# Patient Record
Sex: Female | Born: 1940 | Race: White | Hispanic: No | Smoking: Never smoker
Health system: Southern US, Community
[De-identification: ages and names within clinical notes are randomized; demographics above are authoritative.]

## PROBLEM LIST (undated history)

## (undated) DIAGNOSIS — M1990 Unspecified osteoarthritis, unspecified site: Secondary | ICD-10-CM

## (undated) DIAGNOSIS — K219 Gastro-esophageal reflux disease without esophagitis: Secondary | ICD-10-CM

## (undated) DIAGNOSIS — I10 Essential (primary) hypertension: Secondary | ICD-10-CM

## (undated) DIAGNOSIS — E079 Disorder of thyroid, unspecified: Secondary | ICD-10-CM

## (undated) DIAGNOSIS — F419 Anxiety disorder, unspecified: Secondary | ICD-10-CM

## (undated) DIAGNOSIS — G4700 Insomnia, unspecified: Secondary | ICD-10-CM

## (undated) DIAGNOSIS — E039 Hypothyroidism, unspecified: Secondary | ICD-10-CM

## (undated) HISTORY — PX: TONSILLECTOMY: SUR1361

## (undated) HISTORY — PX: OTHER SURGICAL HISTORY: SHX169

## (undated) HISTORY — DX: Insomnia, unspecified: G47.00

## (undated) HISTORY — PX: ELBOW SURGERY: SHX618

## (undated) HISTORY — PX: ABDOMINAL HYSTERECTOMY: SHX81

## (undated) HISTORY — DX: Anxiety disorder, unspecified: F41.9

## (undated) HISTORY — DX: Hypothyroidism, unspecified: E03.9

---

## 2008-11-29 ENCOUNTER — Inpatient Hospital Stay (HOSPITAL_COMMUNITY): Admission: AD | Admit: 2008-11-29 | Discharge: 2008-12-03 | Payer: Self-pay | Admitting: Orthopaedic Surgery

## 2009-01-04 ENCOUNTER — Inpatient Hospital Stay (HOSPITAL_COMMUNITY): Admission: RE | Admit: 2009-01-04 | Discharge: 2009-01-06 | Payer: Self-pay | Admitting: Orthopaedic Surgery

## 2010-07-29 ENCOUNTER — Encounter: Payer: Self-pay | Admitting: Orthopaedic Surgery

## 2010-09-27 ENCOUNTER — Other Ambulatory Visit: Payer: Self-pay | Admitting: Ophthalmology

## 2010-09-27 ENCOUNTER — Encounter (HOSPITAL_COMMUNITY): Payer: Medicare Other

## 2010-09-27 LAB — CBC
HCT: 36.5 % (ref 36.0–46.0)
Hemoglobin: 11.7 g/dL — ABNORMAL LOW (ref 12.0–15.0)
MCH: 30.2 pg (ref 26.0–34.0)
MCHC: 32.1 g/dL (ref 30.0–36.0)
RBC: 3.87 MIL/uL (ref 3.87–5.11)

## 2010-09-27 LAB — BASIC METABOLIC PANEL
CO2: 24 mEq/L (ref 19–32)
Calcium: 8.6 mg/dL (ref 8.4–10.5)
Chloride: 104 mEq/L (ref 96–112)
GFR calc Af Amer: 53 mL/min — ABNORMAL LOW (ref >60)
Glucose, Bld: 127 mg/dL — ABNORMAL HIGH (ref 70–99)
Sodium: 137 mEq/L (ref 135–145)

## 2010-10-02 ENCOUNTER — Ambulatory Visit (HOSPITAL_COMMUNITY)
Admission: RE | Admit: 2010-10-02 | Discharge: 2010-10-02 | Disposition: A | Discharge status: Home / Self Care | Payer: Medicare Other | Source: Ambulatory Visit | Attending: Ophthalmology | Admitting: Ophthalmology

## 2010-10-02 DIAGNOSIS — H2510 Age-related nuclear cataract, unspecified eye: Secondary | ICD-10-CM | POA: Insufficient documentation

## 2010-10-02 DIAGNOSIS — J449 Chronic obstructive pulmonary disease, unspecified: Secondary | ICD-10-CM | POA: Insufficient documentation

## 2010-10-02 DIAGNOSIS — J4489 Other specified chronic obstructive pulmonary disease: Secondary | ICD-10-CM | POA: Insufficient documentation

## 2010-10-02 DIAGNOSIS — Z01812 Encounter for preprocedural laboratory examination: Secondary | ICD-10-CM | POA: Insufficient documentation

## 2010-10-16 LAB — DIFFERENTIAL
Eosinophils Absolute: 0.2 10*3/uL (ref 0.0–0.7)
Lymphocytes Relative: 29 % (ref 12–46)
Lymphs Abs: 2.5 10*3/uL (ref 0.7–4.0)
Monocytes Relative: 8 % (ref 3–12)
Neutro Abs: 5.2 10*3/uL (ref 1.7–7.7)
Neutrophils Relative %: 60 % (ref 43–77)

## 2010-10-16 LAB — CBC
Hemoglobin: 10.8 g/dL — ABNORMAL LOW (ref 12.0–15.0)
MCHC: 32.5 g/dL (ref 30.0–36.0)
MCV: 90.3 fL (ref 78.0–100.0)
RBC: 3.67 MIL/uL — ABNORMAL LOW (ref 3.87–5.11)
RDW: 14.7 % (ref 11.5–15.5)

## 2010-10-16 LAB — COMPREHENSIVE METABOLIC PANEL
BUN: 10 mg/dL (ref 6–23)
CO2: 27 mEq/L (ref 19–32)
Calcium: 8.7 mg/dL (ref 8.4–10.5)
Creatinine, Ser: 1.38 mg/dL — ABNORMAL HIGH (ref 0.4–1.2)
GFR calc non Af Amer: 38 mL/min — ABNORMAL LOW (ref >60)
Glucose, Bld: 101 mg/dL — ABNORMAL HIGH (ref 70–99)
Sodium: 135 mEq/L (ref 135–145)
Total Protein: 6.7 g/dL (ref 6.0–8.3)

## 2010-10-16 LAB — URINALYSIS, ROUTINE W REFLEX MICROSCOPIC
Glucose, UA: NEGATIVE mg/dL
Hgb urine dipstick: NEGATIVE
Protein, ur: NEGATIVE mg/dL
Specific Gravity, Urine: 1.01 (ref 1.005–1.030)
pH: 5.5 (ref 5.0–8.0)

## 2010-10-16 LAB — URINE MICROSCOPIC-ADD ON

## 2010-10-17 LAB — DIFFERENTIAL
Basophils Absolute: 0 10*3/uL (ref 0.0–0.1)
Basophils Relative: 0 % (ref 0–1)
Lymphocytes Relative: 16 % (ref 12–46)
Neutro Abs: 8.7 10*3/uL — ABNORMAL HIGH (ref 1.7–7.7)
Neutrophils Relative %: 76 % (ref 43–77)

## 2010-10-17 LAB — BASIC METABOLIC PANEL
CO2: 24 mEq/L (ref 19–32)
Calcium: 8.7 mg/dL (ref 8.4–10.5)
Creatinine, Ser: 1.68 mg/dL — ABNORMAL HIGH (ref 0.4–1.2)
GFR calc Af Amer: 37 mL/min — ABNORMAL LOW (ref >60)
GFR calc non Af Amer: 28 mL/min — ABNORMAL LOW (ref >60)
GFR calc non Af Amer: 30 mL/min — ABNORMAL LOW (ref >60)
Glucose, Bld: 123 mg/dL — ABNORMAL HIGH (ref 70–99)
Potassium: 4.3 mEq/L (ref 3.5–5.1)
Sodium: 130 mEq/L — ABNORMAL LOW (ref 135–145)

## 2010-10-17 LAB — CBC
HCT: 30.4 % — ABNORMAL LOW (ref 36.0–46.0)
Hemoglobin: 10.7 g/dL — ABNORMAL LOW (ref 12.0–15.0)
MCHC: 35 g/dL (ref 30.0–36.0)
RBC: 3.35 MIL/uL — ABNORMAL LOW (ref 3.87–5.11)
RBC: 3.73 MIL/uL — ABNORMAL LOW (ref 3.87–5.11)
RDW: 14.7 % (ref 11.5–15.5)
WBC: 11 10*3/uL — ABNORMAL HIGH (ref 4.0–10.5)

## 2010-11-01 ENCOUNTER — Encounter (HOSPITAL_COMMUNITY): Payer: Medicare Other

## 2010-11-06 ENCOUNTER — Ambulatory Visit (HOSPITAL_COMMUNITY)
Admission: RE | Admit: 2010-11-06 | Discharge: 2010-11-06 | Disposition: A | Discharge status: Home / Self Care | Payer: Medicare Other | Source: Ambulatory Visit | Attending: Ophthalmology | Admitting: Ophthalmology

## 2010-11-06 DIAGNOSIS — J449 Chronic obstructive pulmonary disease, unspecified: Secondary | ICD-10-CM | POA: Insufficient documentation

## 2010-11-06 DIAGNOSIS — J4489 Other specified chronic obstructive pulmonary disease: Secondary | ICD-10-CM | POA: Insufficient documentation

## 2010-11-06 DIAGNOSIS — H2510 Age-related nuclear cataract, unspecified eye: Secondary | ICD-10-CM | POA: Insufficient documentation

## 2010-11-21 NOTE — Discharge Summary (Signed)
NAMEFANY, Kim Schultz               ACCOUNT NO.:  [id number]   MEDICAL RECORD NO.:  [id number]          PATIENT TYPE:  INP   LOCATION:  A305                          FACILITY:  [facility]   PHYSICIAN:  J. Darreld Mclean, M.D. DATE OF BIRTH:  [birth year]   DATE OF ADMISSION:  11/29/2008  DATE OF DISCHARGE:  LH                               DISCHARGE SUMMARY   DISCHARGE DIAGNOSES:  Fracture dislocation of the left elbow with  fracture of the left olecranon, fracture of the radial head displaced,  and fracture of the coronoid process nondisplaced.   DISCHARGE STATUS:  Improved.   PROGNOSIS:  Good.   DISPOSITION:  Home.   SURGICAL PROCEDURES:  Open treatment internal fixation of left ulnar  fracture with a figure-of-eight compression system, removal of the  radial head, treatment nondisplaced fracture of the coronoid process,  application of posterior splint.   The patient is to continue her medicines on previously, and I have added  Vicodin 5 mg one every 4 hours p.r.n. pain.  Instructions given.  Medical reconciliation form has been completed.   The patient is to keep her arm elevated in a sling and keep it dry.   I will see her in my office on June 8 at 1:30 p.m. with x-rays at that  time out of the splint, we will remove the staples and put her in a new  cast.   She is to contact me if there is any difficulty and numbers have been  provided.   BRIEF HISTORY:  The patient fell on the day before admission at her home  and injured her left elbow.  She waited during the night in pain, came  to see me in the office early the next morning.  X-rays shows  significant injury.  The patient already eighth time I saw her.  She was  admitted to the hospital for pain control because she was in severe pain  and surgery was scheduled the following day.  She underwent the  procedure well.  Postoperatively, she has done well.  She had 1 only  episode of a temperature spike and rest of the  temperatures have been  normal.  Her labs have been normal.  Neurovascular status has been  intact.  Pain was controlled with the IV PCA morphine and then that was  discontinued because she had some low oxygen sats, in the last day, she  has been on Vicodin 5.  Kept her in  the hospital an extra day because two nights ago, she was having low  oxygen.  She had no low oxygenation last night while on Vicodin.  Precautions have been discussed.  The care of the wound have been  discussed.  Other labs were negative.  It is difficult to contact me as  stated.                                            ______________________________  J. Darreld Mclean, M.D.  JWK/MEDQ  D:  12/03/2008  T:  12/03/2008  Job:  [id number]

## 2010-11-21 NOTE — Op Note (Signed)
Kim Schultz, Kim Schultz               ACCOUNT NO.:  [id number]   MEDICAL RECORD NO.:  [id number]          PATIENT TYPE:  INP   LOCATION:  A307                          FACILITY:  [facility]   PHYSICIAN:  J. Darreld Mclean, M.D. DATE OF BIRTH:  01-Jul-1941   DATE OF PROCEDURE:  DATE OF DISCHARGE:                               OPERATIVE REPORT   PREOPERATIVE DIAGNOSES:  Failure fixation of left elbow olecranon  fracture with loss of reduction.  Left olecranon fracture status post  radial head fracture and radial head removal.  No signs of infection.   POSTOPERATIVE DIAGNOSES:  Failure fixation of left elbow olecranon  fracture with loss of reduction.  Left olecranon fracture status post  radial head fracture and radial head removal.  No signs of infection.   PROCEDURES:  Removal of pins and wire of left elbow, repeat figure-of-  eight procedure with open treatment and internal reduction of left elbow  fracture using figure-of-eight wire and Kirschner wire bone grafting   ANESTHESIA:  General.   TOURNIQUET TIME:  1 hour and 4 minutes.   DRAINS:  None.   Posterior splint applied at the end of the procedure.   SURGEON:  J. Darreld Mclean, MD   INDICATIONS:  The patient is a 70 year old white female who fractured  left elbow on May 24.  She had surgery on May 25, to remove of the  radial head.  Initially, she did well, but seen her back in the office  and the fracture site has slipped, there has been a failure to  fixation.  The Kirschner wires were pulled out and the olecranon  fracture has separated.  No signs of infection.  Recommended a repeat  procedure.  The patient was shown the x-rays, was explained the need for  the repeat procedure.  She understands.  The patient is at increased  risk because of her age.   DESCRIPTION OF PROCEDURE:  The patient was seen in the holding area.  The left arm was identified as correct surgical site.  She placed a  mark, I placed a mark.  She was  brought to the operating room, given  general anesthesia while supine.  Tourniquet was placed and deflated in  the left upper arm.  She was prepped and draped in the usual manner.   I had a time-out identifying Ms. Rattigan as the patient and left elbow as  the correct surgical site.  All the surgical team knew each other and  all instrumentation were deemed to be properly positioned and removed  it.  The arm was wrapped circumferentially with an Esmarch bandage.  Tourniquet was inflated with 250 mmHg.  Esmarch bandage was removed.  Incision was made through previous incision, and the Kirschner wires in  the fracture site was identified.  Kirschner wires were removed and the  figure-of-eight wire was initially cut, but left in place distally.  Fracture site was debrided of granulation tissue, scar tissue, and the  edges were freshened up.  I elected to use bone graft, bone chips.  I  placed this on all sides of the  fracture and then reduced the fracture  and held it in place with 2 smooth Kirschner wires, then added a third  one.  Obtained x-rays.  Knees looked good.  Knees were through the ulna  distal to the fracture.  On original x-rays pins does not penetrate  through the cortices and I think this is why the fracture fell apart.  I  corrected with this.  Figure-of-eight wire was then placed.  The  previous trial and reduction was carried out with figure-of-eight wire  of the fracture making sure the bone graft fragments were maintained.  A  reduction of the olecranon.  X-rays were taken, showed good position and  alignment.  Wires were then cut, bent over the figure-of-eight wire,  then bone tapped in.  The wire had been reapproximated using closure  with a wire tightener.  The lateral had difficulty taking, we took  several laterals to get a true good lateral.  The fracture is reduced.  The wound was reapproximated using 2-0 chromic, 2-0 plain, and skin  staples.  Sterile dressing  applied.  Bulky dressing applied.  Posterior  splint applied.  Tourniquet was deflated after 1 hour and 4 minutes  prior to application of the splinting.  She tolerated the procedure  well.  She will go to recovery in good condition.  She will be admitted  overnight for pain control and observation.           ______________________________  Shela Commons. Darreld Mclean, M.D.     JWK/MEDQ  D:  01/04/2009  T:  01/05/2009  Job:  [id number]

## 2010-11-21 NOTE — Discharge Summary (Signed)
Kim Schultz, Kim Schultz               ACCOUNT NO.:  [id number]   MEDICAL RECORD NO.:  [id number]          PATIENT TYPE:  INP   LOCATION:  A307                          FACILITY:  [facility]   PHYSICIAN:  J. Darreld Mclean, M.D. DATE OF BIRTH:  06-01-1941   DATE OF ADMISSION:  01/04/2009  DATE OF DISCHARGE:  07/01/2010LH                               DISCHARGE SUMMARY   DIAGNOSES:  Fracture to left olecranon post dislocation post radial head  fracture with failure fixation, replacement of fixation.   DISCHARGE STATUS:  Improved.   PROGNOSIS:  Good.   OPERATION PERFORMED:  Removal of pins and wires left elbow, repeat  procedure, bone grafting fixation, open treatment internal fixation of  left olecranon fracture post fracture dislocation and fracture of radial  head.   DISCHARGE MEDICATIONS:  The patient is to resume all of the medications  she was on at admission including her Vicodin ES.  I have given her a  prescription for Vicodin ES.  She is getting low on that.  She is to  resume all the medications as listed on the medical reconciliation.  She  is here and incorporated by reference.   FOLLOWUP:  The patient will be seen in my office in approximately two  weeks for removal of the splint, x-rays out of the cast and removal of  staples.   SPECIFIC CARE INSTRUCTIONS:  Use her sling.  Keep the cast dry.  Move  her fingers as often as possible.  Call me with any difficulties.   BRIEF HISTORY:  The patient was treated medically because she had  failure of fixation of left elbow fracture.  She underwent the above  mentioned procedures, tolerated well.  The first night she had a  temperature one time to 101, but responded to respiratory therapy and  she had no further episodes of temperature.  She has remained afebrile.  Her pain has been controlled.  Today, she is doing very well using the  PCA pump very little.  She can go back on her Vicodin ES as she had at  home.  She knows how to  care for the arm.  If you have any difficulties  let me know.  She can sleep semi-upright.  She will stay in it  approximately two weeks.  She has the numbers for the office and for the  hospital.                                            ______________________________  J. Darreld Mclean, M.D.     JWK/MEDQ  D:  01/06/2009  T:  01/06/2009  Job:  [id number]

## 2010-11-21 NOTE — Op Note (Signed)
NAMEALYNE, Kim Schultz               ACCOUNT NO.:  [id number]   MEDICAL RECORD NO.:  [id number]          PATIENT TYPE:  INP   LOCATION:  A305                          FACILITY:  [facility]   PHYSICIAN:  J. Darreld Mclean, M.D. DATE OF BIRTH:  1941-04-22   DATE OF PROCEDURE:  DATE OF DISCHARGE:                               OPERATIVE REPORT   PREOPERATIVE DIAGNOSES:  Comminuted fracture of the left olecranon,  dislocation of the left elbow, and fracture of the radial head,  irreducible.   POSTOPERATIVE DIAGNOSES:  Comminuted fracture of the left olecranon,  dislocation of the left elbow, and fracture of the radial head,  irreducible.   PROCEDURES:  Excision of left radial head of the elbow.  Open treatment  and internal fixation of the left olecranon fracture using a figure-of-  eight wire and pins.  Debridement of elbow area.   ANESTHESIA:  General.   TOURNIQUET TIME:  56 minutes.   DRAINS:  None used.   Posterior splint applied.   SURGEON:  J. Darreld Mclean, MD   INDICATIONS:  The patient is a 70 year old female who fell 2 days ago  and injured her left elbow, it happened late in the evening.  She came  to my office yesterday as a walk-in, had marked pain and tenderness in  elbow with deformity.  X-ray showed the fracture of the radial hip with  dislocation and irreducible, fracture of the olecranon and the  displacement of the elbow.  Surgery was recommended.  She has already  had breakfast and had eaten.  She was in some significant pain.  I had  her admitted for IV pain control.  Surgery was scheduled today.  She  abraded her knee, but there was more of a skin injury than a bone  injury.  No other injuries.  I discussed with her the risks and  imponderables of the procedure.  This included infection, instability of  the elbow because of removal of the radial head, possible decreased  healing, possible need for further surgery, and nerve injury.  All her  nerve was intact.  She  appeared to understand the procedure and agreed  to it as stated.  She asked appropriate questions.  Members of family  were also present and asked appropriate questions.   DESCRIPTION OF PROCEDURE:  The patient was seen in the holding area.  Her left arm and elbow were identified as correct surgical site.  She  placed a mark, I placed a mark.  She was brought back to the operating  room, placed supine on the operating room table, given general  anesthesia.  Once anesthesia was obtained, guide x-rays of her elbow,  was trying to do a mild closed reduction to ascertain better positioning  of the olecranon fracture and whether the coronoid process had a  fracture.  X-rays did not reveal a definitive fracture of the coronoid.  The comminuted fracture of the olecranon was identified and the radial  head was obviously displaced.  Tourniquet was placed and deflated in the  left upper arm and then she was prepped and draped  in the usual manner.   At a generalized time-out, identified the patient as Kim Schultz,  identified the left arm as correct surgical site.  Members of the team  were identified.  There was a student present who was identified.  All  instrumentation was deemed properly positioned and available and ready.  She had previously been given 1 g of Ancef prior to the induction of  anesthesia.   Arm was elevated, wrapped circumferentially with an Esmarch bandage.  Tourniquet inflated to 250 mmHg.  Esmarch bandage was removed.  Incision  was made over the olecranon area extending proximally and distally.  Fracture site was identified at the olecranon.  There was hematoma  present.  This was removed.  By opening up the olecranon fracture,  distracted, at this time I could see the radial head, it was very  prominent, and removed this without difficulty.  We debrided the area  and removed small fragments.  The area was cleansed thoroughly.  I did  not appreciate any other bony fragments.   The olecranon was then  reapproximated and held in place with a clamp and then figure-of-eight  wire was placed, 18-gauge.  Two smooth Kirschner wires 0.062 were placed  and a figure-of-eight compression-type system was done with tightening  the figure-of-eight wire.  X-rays were taken throughout this and showed  good position alignment of the wires.  The wires were cut.  Final x-rays  were taken and these looked good in AP and lateral view.  Elbow was  stable.  Flexion and extension was stable.  There were no bony fragments  identified on x-ray.  The wound was then reapproximated using 2-0 plain  skin staples.  Sterile dressing was applied.  Bulky dressing was  applied.  Tourniquet deflated after 56 minutes.  Posterior splint was  applied.  The patient tolerated the procedure well and went to recovery  in good condition.  She will continue on her current medications.           ______________________________  Shela Commons. Darreld Mclean, M.D.     JWK/MEDQ  D:  11/30/2008  T:  12/01/2008  Job:  [id number]

## 2010-11-21 NOTE — H&P (Signed)
NAMEKAMRIN, SPATH               ACCOUNT NO.:  [id number]   MEDICAL RECORD NO.:  [id number]          PATIENT TYPE:  INP   LOCATION:  NA                            FACILITY:  [facility]   PHYSICIAN:  J. Darreld Mclean, M.D. DATE OF BIRTH:  1941/07/05   DATE OF ADMISSION:  DATE OF DISCHARGE:  LH                              HISTORY & PHYSICAL   CHIEF COMPLAINT:  I fell and hurt my elbow.   Patient presented to my office this morning after we opened with  complaints of severe pain and tenderness to her left elbow.  She fell  late last night and injured the elbow.  She felt a snap and a pop.  She  had significant pain all night.  She did not want to go to the emergency  room.  She has ecchymosis to the elbow, marked pain, marked decrease of  motion.  X-rays in the office show a fracture of the olecranon displaced  with a fracture of the radial head completely displaced.  The elbow was  basically located but multiple pieces.  The distal humerus looks intact.  There were no other injuries except to the left knee which she has some  skin abrasions.  There was no head injury.  No loss of consciousness.   She is a patient of Dr. Renard Matter.  She has a history of thyroid problems  for many years.  She takes thyroid medication.  She has a history of  GERD and takes Prilosec for this.  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   Patient has previously had a hysterectomy, foot surgery with pins, and  tonsillectomy and adenoidectomy many years ago.  Patient has a history  of depression.  Dr. Renard Matter is her family physician.  SHE HAS NO  ALLERGIES.   REVIEW OF SYSTEMS:  Are negative except for the things as stated.  There  is no history of heart disease.  No history of stroke.  No history of  pneumonia.  No history of GI problems.   Patient lives in [place] and is accompanied by her daughter   Patient is alert, cooperative, in pain.  VITAL SIGNS:   Stable.  HEENT:  Negative.  NECK:  Supple.  LUNGS:  Clear to P and A.  HEART:  Regular without murmur heard.  ABDOMEN:  Soft, obese, nontender without masses.  EXTREMITIES:  The left elbow has marked decreased range of motion and  pain.  Ecchymosis around the elbow with pain to any type of motion.  Left knee has abrasions to the knee but there is no effusion.  Other  extremities are negative.  So I am able to really test her back because  of her in her elbow.  CNS:  Intact.  SKIN:  Intact except for the abrasions and ecchymosis as noted.   IMPRESSION:  1. Comminuted fracture of the left olecranon with fracture of radial      head displaced.  2. Abrasions, left knee.  3. History of multiple arthralgias and back pain.  4. Thyroid  problems.  5. Gastroesophageal reflux disease.   PLAN:  I will admit her today for IV narcotics.  I will schedule her for  surgery first thing in the morning.  I will not do any anticoagulation  agents as she is having surgery in the morning.  I have applied a  posterior splint to her arm while she was in the office.                                            ______________________________  J. Darreld Mclean, M.D.     JWK/MEDQ  D:  11/29/2008  T:  11/29/2008  Job:  [id number]

## 2010-11-21 NOTE — H&P (Signed)
Kim Schultz, Kim Schultz               ACCOUNT NO.:  [id number]   MEDICAL RECORD NO.:  [id number]          PATIENT TYPE:  AMB   LOCATION:  DAY                           FACILITY:  [facility]   PHYSICIAN:  J. Darreld Mclean, M.D. DATE OF BIRTH:  1941/07/04   DATE OF ADMISSION:  DATE OF DISCHARGE:  LH                              HISTORY & PHYSICAL   CHIEF COMPLAINT:  My elbow is still hurting   The patient fell and injured her left elbow on May 23.  I saw her in the  office on the twenty-fourth and admitted her to the hospital with a  comminuted fracture of the proximal elbow olecranon area.  She had  surgery on her elbow on the twenty-fifth with a excision of left radial  head and a figure-of-eight wire pinning of the elbow with debridement.  Initially, she did well, but when I saw her back here in the office this  past week it was obvious that the figure-of-eight had come apart and the  pins.  The Kirschner wires were held in place and backed out.  This was  on June 24.  I told she would have to  repeat surgery to correct this  problem.  I told her that I have never happen to me before, but it has  happened and I showed the x-rays.  I took her out of her long arm cast  and put her in a new posterior splint.  She wanted to double check with  her insurance company before setting up surgery.  So I told her to come  back to my office on Monday, June 28, and we would confirm with the  insurance company about coverage.  We have since talked to the insurance  company and they will cover her surgery for the correction of this  problem.  She understands the necessity for having this done at this  time.  I have been forthright and I have shown her the x-rays and  explained what has happened.   The patient has a history of:  1. Thyroid problems for many years and takes thyroid supplement.  2. She has GERD, Prilosec.  3. Degenerative joint disease of multiple joints.  4. She has a history of  nervousness and anxiety.  5. She has a history of depression.   She is status post:  1. Hysterectomy.  2. Foot surgery.  3. Tonsillectomy.  4. Adenoidectomy.   Dr. Renard Matter is her family doctor.   SHE HAS NO ALLERGIES.   REVIEW OF SYSTEMS:  Negative except for the left elbow which has pain  and tenderness and prominence of the pins.  The wound is well-healed.  There is no discharge and no purulence.  There is no history of heart  disease.  No history of stroke.  No history of pneumonia.  She does have  GERD.  She does have depression and anxiety.   The patient lives in [place] and was accompanied by her daughter to  the office.   VITAL SIGNS:  Normal.  HEENT:  Negative.  NECK:  Supple.  LUNGS:  Clear to P and A.  HEART:  Regular rhythm without murmur  ABDOMEN:  Soft without masses.  Obese.  LEFT ELBOW:  There is prominence where the pins are at the left  olecranon area.  The wound looks good.  It is well-healed.  Sutures have  been removed.  There is no discharge, no redness.  No irritation.  Range  of motion of course is markedly decreased and she was put a posterior  splint.  OTHER EXTREMITIES:  Negative.  CNS:  Intact.  SKIN:  Intact.   IMPRESSION:  Loss of orthopedic fixation, need to repeat procedure,  fixation of the left olecranon fracture.   I have discussed with the patient planned procedure, risks and  imponderables.  She appears to understand.  Labs are pending.  She will  be admitted after surgery for pain control.                                            ______________________________  J. Darreld Mclean, M.D.     JWK/MEDQ  D:  01/03/2009  T:  01/03/2009  Job:  [id number]

## 2010-12-18 NOTE — Op Note (Signed)
  Kim Schultz, Kim Schultz               ACCOUNT NO.:  [id number]  MEDICAL RECORD NO.:  [id number]           PATIENT TYPE:  O  LOCATION:  DAYP                          FACILITY:  [facility]  PHYSICIAN:  Susanne Greenhouse, MD       DATE OF BIRTH:  [birth year]  DATE OF PROCEDURE: DATE OF DISCHARGE:  11/06/2010                              OPERATIVE REPORT   PREOPERATIVE DIAGNOSIS:  Nuclear cataract, right eye.  POSTOPERATIVE DIAGNOSIS:  Nuclear cataract, right eye.  DIAGNOSIS CODE:  366.16.  OPERATION PERFORMED:  Phacoemulsification with posterior chamber intraocular lens implantation, right eye.  SURGEON:  Bonne Dolores. Dwon Sky, MD  ANESTHESIA:  General endotracheal anesthesia.  OPERATIVE SUMMARY:  In the preoperative area, dilating drops were placed into the right eye.  The patient was then brought into the operating room where she was placed under general anesthesia.  The eye was then prepped and draped.  Beginning with a 75 blade, a paracentesis port was made at the surgeon's 2 o'clock position.  The anterior chamber was then filled with a 1% nonpreserved lidocaine solution with epinephrine.  This was followed by Viscoat to deepen the chamber.  A small fornix-based peritomy was performed superiorly.  Next, a single iris hook was placed through the limbus superiorly.  A 2.4-mm keratome blade was then used to make a clear corneal incision over the iris hook.  A bent cystotome needle and Utrata forceps were used to create a continuous tear capsulotomy.  Hydrodissection was performed using balanced salt solution on a fine cannula.  The lens nucleus was then removed using phacoemulsification in a quadrant cracking technique.  The cortical material was then removed with irrigation and aspiration.  The capsular bag and anterior chamber were refilled with Provisc.  The wound was widened to approximately 3 mm and a posterior chamber intraocular lens was placed into the capsular bag without difficulty using  an [employer] lens injecting system.  A single 10-0 nylon suture was then used to close the incision as well as stromal hydration.  The Provisc was removed from the anterior chamber and capsular bag with irrigation and aspiration.  At this point, the wounds were tested for leak, which were negative.  The anterior chamber remained deep and stable.  The patient tolerated the procedure well.  There were no operative complications, and she awoke from general anesthesia without problem.  Prosthetic device used is a Lenstec posterior chamber lens, model Softec HD, power of 17.5, serial number is [id number].          ______________________________ Susanne Greenhouse, MD     KEH/MEDQ  D:  11/16/2010  T:  11/17/2010  Job:  [id number]  Electronically Signed by Gemma Payor MD on 12/18/2010 12:18:25 PM

## 2015-09-06 ENCOUNTER — Ambulatory Visit: Payer: Medicare Other | Admitting: Orthopaedic Surgery

## 2015-09-13 ENCOUNTER — Ambulatory Visit (INDEPENDENT_AMBULATORY_CARE_PROVIDER_SITE_OTHER): Payer: Medicare Other | Admitting: Orthopaedic Surgery

## 2015-09-13 VITALS — BP 206/114 | HR 91 | Temp 98.1°F | Ht 68.0 in | Wt 191.4 lb

## 2015-09-13 DIAGNOSIS — M25562 Pain in left knee: Secondary | ICD-10-CM | POA: Diagnosis not present

## 2015-09-13 MED ORDER — OXAPROZIN 600 MG PO TABS: 600.0000 mg | ORAL_TABLET | Freq: Two times a day (BID) | ORAL | Status: DC

## 2015-09-13 MED ORDER — HYDROCODONE-ACETAMINOPHEN 7.5-325 MG PO TABS: 1.0000 | ORAL_TABLET | ORAL | Status: DC | PRN

## 2015-09-13 NOTE — Progress Notes (Addendum)
Patient ZO:XWRUEAVW:Kim Schultz, female DOB:10/12/1940, 75 y.o. UJW:[id number]  Chief Complaint  Patient presents with  . Knee Pain    left  . Elbow Pain    left    HPI  Kim Schultz is a 75 y.o. female who has left knee pain chronically.  She has no giving way, no locking, no redness, no trauma.  She has swelling and popping.  Knee Pain  The incident occurred more than 1 week ago. There was no injury mechanism. The pain is present in the left knee. The pain is at a severity of 3/10. The pain is mild. The pain has been fluctuating since onset. Associated symptoms include an inability to bear weight and a loss of motion. The symptoms are aggravated by weight bearing. She has tried ice, heat, elevation and rest for the symptoms. The treatment provided moderate relief.    Body mass index is 29.11 kg/(m^2).   Review of Systems  Constitutional:       Patient does not have Diabetes Mellitus. Patient does not have hypertension. Patient does not have COPD or shortness of breath. Patient does not have BMI > 35. Patient does not have current smoking history.  HENT: Negative for congestion.   Respiratory: Negative for cough and shortness of breath.   Cardiovascular: Negative for chest pain.  Endocrine: Positive for cold intolerance.  Musculoskeletal: Positive for myalgias, back pain, joint swelling, arthralgias and gait problem.  Allergic/Immunologic: Positive for environmental allergies.  Psychiatric/Behavioral: The patient is nervous/anxious.     No past medical history on file.  No past surgical history on file.  No family history on file.  Social History Social History  Substance Use Topics  . Smoking status: Not on file  . Smokeless tobacco: Not on file  . Alcohol Use: Not on file    Allergies  Allergen Reactions  . Naprosyn [Naproxen]     Current Outpatient Prescriptions  Medication Sig Dispense Refill  . cyclobenzaprine (FLEXERIL) 10 MG tablet Take 10 mg by mouth 3  (three) times daily as needed for muscle spasms.    Marland Kitchen. dicyclomine (BENTYL) 10 MG capsule Take 10 mg by mouth 4 (four) times daily -  before meals and at bedtime.    Marland Kitchen. levothyroxine (SYNTHROID, LEVOTHROID) 125 MCG tablet Take 125 mcg by mouth daily before breakfast.    . oxaprozin (DAYPRO) 600 MG tablet Take 1 tablet (600 mg total) by mouth 2 (two) times daily after a meal. 60 tablet 5  . HYDROcodone-acetaminophen (NORCO) 7.5-325 MG tablet Take 1 tablet by mouth every 4 (four) hours as needed for moderate pain (Must last 30 days.  Do not drive or operate machinery while taking this medicine.). 120 tablet 0  . omeprazole (PRILOSEC) 20 MG capsule      No current facility-administered medications for this visit.     Physical Exam  Blood pressure 206/114, pulse 91, temperature 98.1 F (36.7 C), height 5\' 8"  (1.727 m), weight 191 lb 6.4 oz (86.818 kg).  Constitutional: overall normal hygiene, normal nutrition, well developed, normal grooming, normal body habitus. Assistive device:none  Musculoskeletal: gait and station Limp right, muscle tone and strength are normal, no tremors or atrophy is present.  .  Neurological: coordination overall normal.  Deep tendon reflex/nerve stretch intact.  Sensation normal.  Cranial nerves II-XII intact.   Skin:   normal overall no scars except to the left elbow area, lesions, ulcers or rashes. No psoriasis.  Psychiatric: Alert and oriented x 3.  Recent memory  intact, remote memory unclear.  Normal mood and affect. Well groomed.  Good eye contact.  Cardiovascular: overall no swelling, no varicosities, no edema bilaterally, normal temperatures of the legs and arms, no clubbing, cyanosis and good capillary refill.  Lymphatic: palpation is normal. The right lower extremity is examined:  Inspection:  Thigh:  Non-tender and no defects  Knee has swelling 2+ effusion.                        Joint tenderness is present                        Patient is tender  over the medial joint line  Lower Leg:  Has normal appearance and no tenderness or defects  Ankle:  Non-tender and no defects  Foot:  Non-tender and no defects Range of Motion:  Knee:  Range of motion is: 0 to 105                        Crepitus is  present  Ankle:  Range of motion is normal. Strength and Tone:  The left lower extremity has normal strength and tone. Stability:  Knee:  The knee is stable.  Ankle:  The ankle is stable.  She has a lot of social issues and is nervous about her living situation.  I spent some time talking to her about this.  I have gone over exercise program with her.  She tries to be active.  I will refill her Day Pro and also her pain medicine  The patient has been educated about the nature of the problem(s) and counseled on treatment options.  The patient appeared to understand what I have discussed and is in agreement with it.  Encounter Diagnosis  Name Primary?  . Left knee pain Yes    PLAN Call if any problems.  Precautions discussed.  Continue current medications.   Return to clinic 3 months

## 2015-10-03 NOTE — Addendum Note (Signed)
Addended by: Earnstine RegalKEELING, JOHN W on: 10/03/2015 10:11 PM   Modules accepted: Kipp BroodSmartSet

## 2015-12-13 ENCOUNTER — Ambulatory Visit (INDEPENDENT_AMBULATORY_CARE_PROVIDER_SITE_OTHER): Payer: Medicare Other | Admitting: Orthopaedic Surgery

## 2015-12-13 ENCOUNTER — Ambulatory Visit (INDEPENDENT_AMBULATORY_CARE_PROVIDER_SITE_OTHER): Payer: Medicare Other

## 2015-12-13 VITALS — BP 163/105 | HR 97 | Temp 97.7°F | Ht 68.0 in | Wt 190.8 lb

## 2015-12-13 DIAGNOSIS — M25562 Pain in left knee: Secondary | ICD-10-CM

## 2015-12-13 DIAGNOSIS — G8929 Other chronic pain: Secondary | ICD-10-CM

## 2015-12-13 DIAGNOSIS — M25522 Pain in left elbow: Secondary | ICD-10-CM | POA: Diagnosis not present

## 2015-12-13 MED ORDER — HYDROCODONE-ACETAMINOPHEN 7.5-325 MG PO TABS: 1.0000 | ORAL_TABLET | ORAL | Status: DC | PRN

## 2015-12-13 NOTE — Patient Instructions (Signed)
Discussed possibility ofRt TKA.

## 2015-12-13 NOTE — Progress Notes (Signed)
Patient EA:VWUJWJXB:Kim Schultz, female DOB:11/10/1940, 75 y.o. JYN:[id number]  Chief Complaint  Patient presents with  . Follow-up    left knee and left arm    HPI  Kim Schultz is a 75 y.o. female who has chronic pain of the left elbow post surgery of the elbow years ago.  She has no redness, no paresthesias, no new trauma.  She has developed pain in the left knee with giving way at times, swelling and popping.  She has no locking, no new trauma, no redness.  It is not getting any better.  She has had this pain on and off for many months but this is the first time she has told me about it.  She has tried ice, heat, rest, elevation with little help.  HPI  Body mass index is 29.02 kg/(m^2).  ROS  Review of Systems  Constitutional:       Patient does not have Diabetes Mellitus. Patient does not have hypertension. Patient does not have COPD or shortness of breath. Patient does not have BMI > 35. Patient does not have current smoking history.  HENT: Negative for congestion.   Respiratory: Negative for cough and shortness of breath.   Cardiovascular: Negative for chest pain.  Endocrine: Positive for cold intolerance.  Musculoskeletal: Positive for myalgias, back pain, joint swelling, arthralgias and gait problem.  Allergic/Immunologic: Positive for environmental allergies.  Psychiatric/Behavioral: The patient is nervous/anxious.     No past medical history on file.  No past surgical history on file.  No family history on file.  Social History Social History  Substance Use Topics  . Smoking status: Not on file  . Smokeless tobacco: Not on file  . Alcohol Use: Not on file    Allergies  Allergen Reactions  . Naprosyn [Naproxen]     Current Outpatient Prescriptions  Medication Sig Dispense Refill  . cyclobenzaprine (FLEXERIL) 10 MG tablet Take 10 mg by mouth 3 (three) times daily as needed for muscle spasms.    Marland Kitchen. dicyclomine (BENTYL) 10 MG capsule Take 10 mg by mouth 4  (four) times daily -  before meals and at bedtime.    Marland Kitchen. HYDROcodone-acetaminophen (NORCO) 7.5-325 MG tablet Take 1 tablet by mouth every 4 (four) hours as needed for moderate pain (Must last 30 days.  Do not drive or operate machinery while taking this medicine.). 120 tablet 0  . levothyroxine (SYNTHROID, LEVOTHROID) 125 MCG tablet Take 125 mcg by mouth daily before breakfast.    . omeprazole (PRILOSEC) 20 MG capsule     . oxaprozin (DAYPRO) 600 MG tablet Take 1 tablet (600 mg total) by mouth 2 (two) times daily after a meal. 60 tablet 5   No current facility-administered medications for this visit.     Physical Exam  Blood pressure 163/105, pulse 97, temperature 97.7 F (36.5 C), height 5\' 8"  (1.727 m), weight 190 lb 12.8 oz (86.546 kg).  Constitutional: overall normal hygiene, normal nutrition, well developed, normal grooming, normal body habitus. Assistive device:none  Musculoskeletal: gait and station Limp left, muscle tone and strength are normal, no tremors or atrophy is present.  .  Neurological: coordination overall normal.  Deep tendon reflex/nerve stretch intact.  Sensation normal.  Cranial nerves II-XII intact.   Skin:   normal overall no scars, lesions, ulcers or rashes. No psoriasis.  Psychiatric: Alert and oriented x 3.  Recent memory intact, remote memory unclear.  Normal mood and affect. Well groomed.  Good eye contact.  Cardiovascular: overall no swelling,  no varicosities, no edema bilaterally, normal temperatures of the legs and arms, no clubbing, cyanosis and good capillary refill.  Lymphatic: palpation is normal.  The left lower extremity is examined:  Inspection:  Thigh:  Non-tender and no defects  Knee has swelling 1+ effusion.                        Joint tenderness is present                        Patient is tender over the medial joint line  Lower Leg:  Has normal appearance and no tenderness or defects  Ankle:  Non-tender and no defects  Foot:   Non-tender and no defects Range of Motion:  Knee:  Range of motion is: 0-105                        Crepitus is  present  Ankle:  Range of motion is normal. Strength and Tone:  The left lower extremity has normal strength and tone. Stability:  Knee:  The knee is stable.  Ankle:  The ankle is stable.  X-rays were ordered of the left knee, reported separately.  The patient has been educated about the nature of the problem(s) and counseled on treatment options.  The patient appeared to understand what I have discussed and is in agreement with it.  Encounter Diagnoses  Name Primary?  . Left knee pain Yes  . Elbow pain, chronic, left    She declines injection in the left knee today.   PLAN Call if any problems.  Precautions discussed.  Continue current medications.   Return to clinic 3 months   Electronically Signed Darreld Mclean, MD 6/6/20172:56 PM

## 2016-02-15 ENCOUNTER — Telehealth: Payer: Self-pay | Admitting: Orthopaedic Surgery

## 2016-02-15 NOTE — Telephone Encounter (Signed)
[facility]  oxaprozin (DAYPRO) 600 MG tablet 60 tablet 5

## 2016-02-16 MED ORDER — OXAPROZIN 600 MG PO TABS: 600.0000 mg | ORAL_TABLET | Freq: Two times a day (BID) | ORAL | 5 refills | Status: DC

## 2016-03-15 ENCOUNTER — Encounter: Payer: Self-pay | Admitting: Orthopaedic Surgery

## 2016-03-15 ENCOUNTER — Ambulatory Visit (INDEPENDENT_AMBULATORY_CARE_PROVIDER_SITE_OTHER): Payer: Medicare Other | Admitting: Orthopaedic Surgery

## 2016-03-15 VITALS — BP 164/99 | HR 95 | Temp 97.9°F | Ht 68.0 in | Wt 188.0 lb

## 2016-03-15 DIAGNOSIS — G8929 Other chronic pain: Secondary | ICD-10-CM | POA: Diagnosis not present

## 2016-03-15 DIAGNOSIS — M25562 Pain in left knee: Secondary | ICD-10-CM

## 2016-03-15 DIAGNOSIS — M25522 Pain in left elbow: Secondary | ICD-10-CM | POA: Diagnosis not present

## 2016-03-15 MED ORDER — HYDROCODONE-ACETAMINOPHEN 7.5-325 MG PO TABS: 1.0000 | ORAL_TABLET | ORAL | 0 refills | Status: DC | PRN

## 2016-03-15 MED ORDER — CYCLOBENZAPRINE HCL 10 MG PO TABS: 10.0000 mg | ORAL_TABLET | Freq: Three times a day (TID) | ORAL | 3 refills | Status: DC | PRN

## 2016-03-15 NOTE — Progress Notes (Signed)
Patient ZO:XWRUEAVW:Kim Schultz, female DOB:02/25/1941, 75 y.o. UJW:[id number]  Chief Complaint  Patient presents with  . Follow-up    left knee, left arm    HPI  Kim Schultz is a 75 y.o. female who has chronic left elbow and left knee pain.  She has no new trauma, no redness, no paresthesias.  She is active and taking her medicine. HPI  Body mass index is 28.59 kg/m.  ROS  Review of Systems  Constitutional:       Patient does not have Diabetes Mellitus. Patient does not have hypertension. Patient does not have COPD or shortness of breath. Patient does not have BMI > 35. Patient does not have current smoking history.  HENT: Negative for congestion.   Respiratory: Negative for cough and shortness of breath.   Cardiovascular: Negative for chest pain.  Endocrine: Positive for cold intolerance.  Musculoskeletal: Positive for arthralgias, back pain, gait problem, joint swelling and myalgias.  Allergic/Immunologic: Positive for environmental allergies.  Psychiatric/Behavioral: The patient is nervous/anxious.     No past medical history on file.  No past surgical history on file.  No family history on file.  Social History Social History  Substance Use Topics  . Smoking status: Never Smoker  . Smokeless tobacco: Never Used  . Alcohol use Not on file    Allergies  Allergen Reactions  . Naprosyn [Naproxen]     Current Outpatient Prescriptions  Medication Sig Dispense Refill  . cyclobenzaprine (FLEXERIL) 10 MG tablet Take 1 tablet (10 mg total) by mouth 3 (three) times daily as needed for muscle spasms. 30 tablet 3  . dicyclomine (BENTYL) 10 MG capsule Take 10 mg by mouth 4 (four) times daily -  before meals and at bedtime.    Marland Kitchen. HYDROcodone-acetaminophen (NORCO) 7.5-325 MG tablet Take 1 tablet by mouth every 4 (four) hours as needed for moderate pain (Must last 30 days.  Do not drive or operate machinery while taking this medicine.). 120 tablet 0  . levothyroxine  (SYNTHROID, LEVOTHROID) 125 MCG tablet Take 125 mcg by mouth daily before breakfast.    . omeprazole (PRILOSEC) 20 MG capsule     . oxaprozin (DAYPRO) 600 MG tablet Take 1 tablet (600 mg total) by mouth 2 (two) times daily after a meal. 60 tablet 5   No current facility-administered medications for this visit.      Physical Exam  Blood pressure (!) 164/99, pulse 95, temperature 97.9 F (36.6 C), height 5\' 8"  (1.727 m), weight 188 lb (85.3 kg).  Constitutional: overall normal hygiene, normal nutrition, well developed, normal grooming, normal body habitus. Assistive device:none  Musculoskeletal: gait and station Limp left, muscle tone and strength are normal, no tremors or atrophy is present.  .  Neurological: coordination overall normal.  Deep tendon reflex/nerve stretch intact.  Sensation normal.  Cranial nerves II-XII intact.   Skin:   Normal overall no scars, lesions, ulcers or rashes. No psoriasis.  Psychiatric: Alert and oriented x 3.  Recent memory intact, remote memory unclear.  Normal mood and affect. Well groomed.  Good eye contact.  Cardiovascular: overall no swelling, no varicosities, no edema bilaterally, normal temperatures of the legs and arms, no clubbing, cyanosis and good capillary refill.  Lymphatic: palpation is normal.  Her left elbow is tender. ROM 5 to 120.  NV intact.  The left lower extremity is examined:  Inspection:  Thigh:  Non-tender and no defects  Knee has swelling 1+  effusion.  Joint tenderness is present                        Patient is tender over the medial joint line  Lower Leg:  Has normal appearance and no tenderness or defects  Ankle:  Non-tender and no defects  Foot:  Non-tender and no defects Range of Motion:  Knee:  Range of motion is: 0-105                        Crepitus is  present  Ankle:  Range of motion is normal. Strength and Tone:  The left lower extremity has normal strength and  tone. Stability:  Knee:  The knee is stable.  Ankle:  The ankle is stable.     The patient has been educated about the nature of the problem(s) and counseled on treatment options.  The patient appeared to understand what I have discussed and is in agreement with it.  Encounter Diagnoses  Name Primary?  . Left knee pain Yes  . Elbow pain, chronic, left     PLAN Call if any problems.  Precautions discussed.  Continue current medications.   Return to clinic 3 months   Electronically Signed Darreld Mclean, MD 9/7/20171:49 PM

## 2016-06-14 ENCOUNTER — Ambulatory Visit (INDEPENDENT_AMBULATORY_CARE_PROVIDER_SITE_OTHER): Payer: Medicare Other | Admitting: Orthopaedic Surgery

## 2016-06-14 ENCOUNTER — Encounter: Payer: Self-pay | Admitting: Orthopaedic Surgery

## 2016-06-14 VITALS — BP 167/97 | HR 88 | Temp 97.7°F | Ht 68.0 in | Wt 185.0 lb

## 2016-06-14 DIAGNOSIS — M25562 Pain in left knee: Secondary | ICD-10-CM

## 2016-06-14 DIAGNOSIS — M25522 Pain in left elbow: Secondary | ICD-10-CM | POA: Diagnosis not present

## 2016-06-14 DIAGNOSIS — G8929 Other chronic pain: Secondary | ICD-10-CM

## 2016-06-14 MED ORDER — CYCLOBENZAPRINE HCL 10 MG PO TABS: 10.0000 mg | ORAL_TABLET | Freq: Every day | ORAL | 3 refills | Status: DC

## 2016-06-14 MED ORDER — HYDROCODONE-ACETAMINOPHEN 7.5-325 MG PO TABS: 1.0000 | ORAL_TABLET | ORAL | 0 refills | Status: DC | PRN

## 2016-06-14 NOTE — Progress Notes (Signed)
Patient ZO:XWRUEAVW:Kim Schultz, female DOB:08/25/1940, 75 y.o. UJW:[id number]  Chief Complaint  Patient presents with  . Follow-up    left leg and left elbow    HPI  Kim Schultz is a 75 y.o. female who has chronic knee pain on the left.  She has swelling and popping.  She has no trauma. She has no locking.  She has more pain in the cold weather.  She has pain in the left elbow, post old fracture.  She has no new trauma.  She is taking her medicine. HPI  Body mass index is 28.13 kg/m.  ROS  Review of Systems  Constitutional:       Patient does not have Diabetes Mellitus. Patient does not have hypertension. Patient does not have COPD or shortness of breath. Patient does not have BMI > 35. Patient does not have current smoking history.  HENT: Negative for congestion.   Respiratory: Negative for cough and shortness of breath.   Cardiovascular: Negative for chest pain.  Endocrine: Positive for cold intolerance.  Musculoskeletal: Positive for arthralgias, back pain, gait problem, joint swelling and myalgias.  Allergic/Immunologic: Positive for environmental allergies.  Psychiatric/Behavioral: The patient is nervous/anxious.     No past medical history on file.  No past surgical history on file.  No family history on file.  Social History Social History  Substance Use Topics  . Smoking status: Never Smoker  . Smokeless tobacco: Never Used  . Alcohol use Not on file    Allergies  Allergen Reactions  . Naprosyn [Naproxen]     Current Outpatient Prescriptions  Medication Sig Dispense Refill  . cyclobenzaprine (FLEXERIL) 10 MG tablet Take 1 tablet (10 mg total) by mouth at bedtime. 90 tablet 3  . dicyclomine (BENTYL) 10 MG capsule Take 10 mg by mouth 4 (four) times daily -  before meals and at bedtime.    Marland Kitchen. HYDROcodone-acetaminophen (NORCO) 7.5-325 MG tablet Take 1 tablet by mouth every 4 (four) hours as needed for moderate pain (Must last 30 days.  Do not drive or  operate machinery while taking this medicine.). 120 tablet 0  . levothyroxine (SYNTHROID, LEVOTHROID) 125 MCG tablet Take 125 mcg by mouth daily before breakfast.    . omeprazole (PRILOSEC) 20 MG capsule     . oxaprozin (DAYPRO) 600 MG tablet Take 1 tablet (600 mg total) by mouth 2 (two) times daily after a meal. 60 tablet 5   No current facility-administered medications for this visit.      Physical Exam  Blood pressure (!) 167/97, pulse 88, temperature 97.7 F (36.5 C), height 5\' 8"  (1.727 m), weight 185 lb (83.9 kg).  Constitutional: overall normal hygiene, normal nutrition, well developed, normal grooming, normal body habitus. Assistive device:none  Musculoskeletal: gait and station Limp left, muscle tone and strength are normal, no tremors or atrophy is present.  .  Neurological: coordination overall normal.  Deep tendon reflex/nerve stretch intact.  Sensation normal.  Cranial nerves II-XII intact.   Skin:   Normal overall no scars, lesions, ulcers or rashes. No psoriasis.  Psychiatric: Alert and oriented x 3.  Recent memory intact, remote memory unclear.  Normal mood and affect. Well groomed.  Good eye contact.  Cardiovascular: overall no swelling, no varicosities, no edema bilaterally, normal temperatures of the legs and arms, no clubbing, cyanosis and good capillary refill.  Lymphatic: palpation is normal.  The left lower extremity is examined:  Inspection:  Thigh:  Non-tender and no defects  Knee has swelling 1+  effusion.                        Joint tenderness is present                        Patient is tender over the medial joint line  Lower Leg:  Has normal appearance and no tenderness or defects  Ankle:  Non-tender and no defects  Foot:  Non-tender and no defects Range of Motion:  Knee:  Range of motion is: 0-100                        Crepitus is  present  Ankle:  Range of motion is normal. Strength and Tone:  The left lower extremity has normal strength  and tone. Stability:  Knee:  The knee is stable.  Ankle:  The ankle is stable.  Her left elbow has ROM of 5 to 115.  She has pain over the olecranon area.  She has well healed scars.  NV is intact.  The patient has been educated about the nature of the problem(s) and counseled on treatment options.  The patient appeared to understand what I have discussed and is in agreement with it.  Encounter Diagnoses  Name Primary?  . Chronic pain of left knee Yes  . Elbow pain, chronic, left     PLAN Call if any problems.  Precautions discussed.  Continue current medications.   Return to clinic 3 months   Electronically Signed Darreld McleanWayne Emelie Newsom, MD 12/7/20172:43 PM

## 2016-06-26 ENCOUNTER — Encounter (HOSPITAL_COMMUNITY): Payer: Self-pay

## 2016-06-26 ENCOUNTER — Emergency Department (HOSPITAL_COMMUNITY): Payer: Medicare Other

## 2016-06-26 ENCOUNTER — Emergency Department (HOSPITAL_COMMUNITY)
Admission: EM | Admit: 2016-06-26 | Discharge: 2016-06-27 | Disposition: A | Discharge status: Home / Self Care | Payer: Medicare Other | Attending: Emergency Medicine | Admitting: Emergency Medicine

## 2016-06-26 DIAGNOSIS — I10 Essential (primary) hypertension: Secondary | ICD-10-CM | POA: Diagnosis not present

## 2016-06-26 DIAGNOSIS — Z79899 Other long term (current) drug therapy: Secondary | ICD-10-CM | POA: Diagnosis not present

## 2016-06-26 DIAGNOSIS — K0889 Other specified disorders of teeth and supporting structures: Secondary | ICD-10-CM | POA: Diagnosis not present

## 2016-06-26 DIAGNOSIS — R519 Headache, unspecified: Secondary | ICD-10-CM

## 2016-06-26 DIAGNOSIS — R51 Headache: Secondary | ICD-10-CM

## 2016-06-26 HISTORY — DX: Gastro-esophageal reflux disease without esophagitis: K21.9

## 2016-06-26 HISTORY — DX: Disorder of thyroid, unspecified: E07.9

## 2016-06-26 HISTORY — DX: Unspecified osteoarthritis, unspecified site: M19.90

## 2016-06-26 HISTORY — DX: Essential (primary) hypertension: I10

## 2016-06-26 MED ORDER — HYDROCODONE-ACETAMINOPHEN 5-325 MG PO TABS
2.0000 | ORAL_TABLET | Freq: Once | ORAL | Status: AC
Start: 2016-06-27 — End: 2016-06-26
  Administered 2016-06-26: 2 via ORAL
  Filled 2016-06-26: qty 2

## 2016-06-26 MED ORDER — ONDANSETRON HCL 4 MG PO TABS
4.0000 mg | ORAL_TABLET | Freq: Once | ORAL | Status: AC
  Administered 2016-06-26: 4 mg via ORAL
  Filled 2016-06-26: qty 1

## 2016-06-26 NOTE — ED Triage Notes (Signed)
Patient states that she fell last Wednesday and injured her head, face, legs, left elbow.  Complaining with pain in her head and face.  My face is so sore that I can't hardly touch it per pt.  I had a pair of bedroom shoes on and the next thing I know I had fell.  Did not consciousness.

## 2016-06-27 ENCOUNTER — Emergency Department (HOSPITAL_COMMUNITY): Payer: Medicare Other

## 2016-06-27 DIAGNOSIS — K0889 Other specified disorders of teeth and supporting structures: Secondary | ICD-10-CM | POA: Diagnosis not present

## 2016-06-27 LAB — URINALYSIS, ROUTINE W REFLEX MICROSCOPIC
Bilirubin Urine: NEGATIVE
Glucose, UA: NEGATIVE mg/dL
Ketones, ur: NEGATIVE mg/dL
Nitrite: NEGATIVE
Protein, ur: NEGATIVE mg/dL
Specific Gravity, Urine: 1.005 (ref 1.005–1.030)
WBC, UA: NONE SEEN WBC/hpf (ref 0–5)
pH: 6 (ref 5.0–8.0)

## 2016-06-27 LAB — COMPREHENSIVE METABOLIC PANEL
ALT: 10 U/L — AB (ref 14–54)
AST: 23 U/L (ref 15–41)
Albumin: 3.5 g/dL (ref 3.5–5.0)
Alkaline Phosphatase: 55 U/L (ref 38–126)
Anion gap: 7 (ref 5–15)
BILIRUBIN TOTAL: 0.6 mg/dL (ref 0.3–1.2)
BUN: 10 mg/dL (ref 6–20)
CALCIUM: 8.7 mg/dL — AB (ref 8.9–10.3)
CO2: 24 mmol/L (ref 22–32)
CREATININE: 1.1 mg/dL — AB (ref 0.44–1.00)
Chloride: 102 mmol/L (ref 101–111)
GFR, EST AFRICAN AMERICAN: 56 mL/min — AB (ref >60)
GFR, EST NON AFRICAN AMERICAN: 48 mL/min — AB (ref >60)
Glucose, Bld: 115 mg/dL — ABNORMAL HIGH (ref 65–99)
Potassium: 3.8 mmol/L (ref 3.5–5.1)
Sodium: 133 mmol/L — ABNORMAL LOW (ref 135–145)
TOTAL PROTEIN: 6.8 g/dL (ref 6.5–8.1)

## 2016-06-27 LAB — CBC WITH DIFFERENTIAL/PLATELET
BASOS ABS: 0 10*3/uL (ref 0.0–0.1)
BASOS PCT: 0 %
EOS ABS: 0 10*3/uL (ref 0.0–0.7)
EOS PCT: 0 %
HCT: 37.4 % (ref 36.0–46.0)
Hemoglobin: 12.4 g/dL (ref 12.0–15.0)
Lymphocytes Relative: 27 %
Lymphs Abs: 2.4 10*3/uL (ref 0.7–4.0)
MCH: 32 pg (ref 26.0–34.0)
MCHC: 33.2 g/dL (ref 30.0–36.0)
MCV: 96.6 fL (ref 78.0–100.0)
MONO ABS: 0.7 10*3/uL (ref 0.1–1.0)
Monocytes Relative: 7 %
Neutro Abs: 5.8 10*3/uL (ref 1.7–7.7)
Neutrophils Relative %: 66 %
PLATELETS: 201 10*3/uL (ref 150–400)
RBC: 3.87 MIL/uL (ref 3.87–5.11)
RDW: 12.8 % (ref 11.5–15.5)
WBC: 8.9 10*3/uL (ref 4.0–10.5)

## 2016-06-27 LAB — LACTIC ACID, PLASMA: LACTIC ACID, VENOUS: 0.9 mmol/L (ref 0.5–1.9)

## 2016-06-27 MED ORDER — PENICILLIN V POTASSIUM 500 MG PO TABS: 500.0000 mg | ORAL_TABLET | Freq: Four times a day (QID) | ORAL | 0 refills | Status: AC

## 2016-06-27 MED ORDER — SODIUM CHLORIDE 0.9 % IV SOLN
1000.0000 mL | INTRAVENOUS | Status: DC
  Administered 2016-06-27: 1000 mL via INTRAVENOUS

## 2016-06-27 MED ORDER — SODIUM CHLORIDE 0.9 % IV SOLN
1000.0000 mL | Freq: Once | INTRAVENOUS | Status: AC
  Administered 2016-06-27: 1000 mL via INTRAVENOUS

## 2016-06-27 NOTE — ED Provider Notes (Signed)
By signing my name below, I, Kim Schultz, attest that this documentation has been prepared under the direction and in the presence of Kim AlbeIva Yani Coventry, MD . Electronically Signed: Levon HedgerElizabeth Schultz, Scribe. 06/27/2016. 1:02 AM.   Kim PatersonMargaret F Kever is a 75 y.o. female who presents to the Emergency Department complaining of constant, worsening left upper premolar dental pain which began yesterday. Pain is unchanged by heat or cold. No treatments tried PTA.  She notes associated diffuse facial pain.  She states she fell backwards last week and hit the back of her head.Pt denies LOC, fever, chills, dysuria, or any other associated symptoms.    Results for orders placed or performed during the hospital encounter of 06/26/16  Blood culture (routine x 2)  Result Value Ref Range   Specimen Description BLOOD RIGHT FOREARM    Special Requests BOTTLES DRAWN AEROBIC AND ANAEROBIC 10CC    Culture PENDING    Report Status PENDING   Blood culture (routine x 2)  Result Value Ref Range   Specimen Description BLOOD RIGHT FOREARM    Special Requests      BOTTLES DRAWN AEROBIC AND ANAEROBIC AEB 10CC ANA 4CC   Culture PENDING    Report Status PENDING   Urinalysis, Routine w reflex microscopic  Result Value Ref Range   Color, Urine STRAW (A) YELLOW   APPearance CLEAR CLEAR   Specific Gravity, Urine 1.005 1.005 - 1.030   pH 6.0 5.0 - 8.0   Glucose, UA NEGATIVE NEGATIVE mg/dL   Hgb urine dipstick SMALL (A) NEGATIVE   Bilirubin Urine NEGATIVE NEGATIVE   Ketones, ur NEGATIVE NEGATIVE mg/dL   Protein, ur NEGATIVE NEGATIVE mg/dL   Nitrite NEGATIVE NEGATIVE   Leukocytes, UA SMALL (A) NEGATIVE   RBC / HPF 0-5 0 - 5 RBC/hpf   WBC, UA NONE SEEN 0 - 5 WBC/hpf   Bacteria, UA RARE (A) NONE SEEN  Comprehensive metabolic panel  Result Value Ref Range   Sodium 133 (L) 135 - 145 mmol/L   Potassium 3.8 3.5 - 5.1 mmol/L   Chloride 102 101 - 111 mmol/L   CO2 24 22 - 32 mmol/L   Glucose, Bld 115 (H) 65 - 99 mg/dL   BUN 10 6  - 20 mg/dL   Creatinine, Ser 4.091.10 (H) 0.44 - 1.00 mg/dL   Calcium 8.7 (L) 8.9 - 10.3 mg/dL   Total Protein 6.8 6.5 - 8.1 g/dL   Albumin 3.5 3.5 - 5.0 g/dL   AST 23 15 - 41 U/L   ALT 10 (L) 14 - 54 U/L   Alkaline Phosphatase 55 38 - 126 U/L   Total Bilirubin 0.6 0.3 - 1.2 mg/dL   GFR calc non Af Amer 48 (L) >60 mL/min   GFR calc Af Amer 56 (L) >60 mL/min   Anion gap 7 5 - 15  Lactic acid, plasma  Result Value Ref Range   Lactic Acid, Venous 0.9 0.5 - 1.9 mmol/L  CBC with Differential  Result Value Ref Range   WBC 8.9 4.0 - 10.5 K/uL   RBC 3.87 3.87 - 5.11 MIL/uL   Hemoglobin 12.4 12.0 - 15.0 g/dL   HCT 81.137.4 91.436.0 - 78.246.0 %   MCV 96.6 78.0 - 100.0 fL   MCH 32.0 26.0 - 34.0 pg   MCHC 33.2 30.0 - 36.0 g/dL   RDW 95.612.8 21.311.5 - 08.615.5 %   Platelets 201 150 - 400 K/uL   Neutrophils Relative % 66 %   Neutro Abs 5.8 1.7 - 7.7  K/uL   Lymphocytes Relative 27 %   Lymphs Abs 2.4 0.7 - 4.0 K/uL   Monocytes Relative 7 %   Monocytes Absolute 0.7 0.1 - 1.0 K/uL   Eosinophils Relative 0 %   Eosinophils Absolute 0.0 0.0 - 0.7 K/uL   Basophils Relative 0 %   Basophils Absolute 0.0 0.0 - 0.1 K/uL   Laboratory interpretation all normal except Minimal renal insufficiency   Dg Chest 2 View  Result Date: 06/27/2016 CLINICAL DATA:  Initial evaluation for acute fever. EXAM: CHEST  2 VIEW COMPARISON:  Prior radiograph from 11/29/2008. FINDINGS: The cardiac and mediastinal silhouettes are stable in size and contour, and remain within normal limits. The lungs are normally inflated. No airspace consolidation, pleural effusion, or pulmonary edema is identified. There is no pneumothorax. Extent to a shin of the normal thoracic kyphosis. No acute osseous abnormality. Osteopenia noted. IMPRESSION: No active cardiopulmonary disease. Electronically Signed   By: Kim MuBenjamin  Schultz M.D.   On: 06/27/2016 01:52   Ct Maxillofacial Wo Contrast  Result Date: 06/27/2016 CLINICAL DATA:  Initial evaluation for recent  fall, now with acute left face pain. EXAM: CT MAXILLOFACIAL WITHOUT CONTRAST TECHNIQUE: Multidetector CT imaging of the maxillofacial structures was performed. Multiplanar CT image reconstructions were also generated. A small metallic BB was placed on the right temple in order to reliably differentiate right from left. COMPARISON:  None. FINDINGS: Osseous: Zygomatic arches intact. No acute maxillary fracture. Pterygoid plates intact. Nasal bones intact. Nasal septum mildly bowed to the right but intact. Mandible intact. Mandibular condyles normally situated. No acute abnormality about the dentition. Orbits: Globes intact. Patient is status post lens extraction bilaterally. No retro-orbital hematoma or other pathology. Bony orbits intact without evidence orbital floor fracture. Sinuses: Mild scattered mucosal thickening within the anterior ethmoidal air cells. Paranasal sinuses are otherwise clear. Mastoids are clear. Middle ear cavities are clear. Soft tissues: No appreciable soft tissue swelling identified within the face. Scattered vascular calcifications noted about the carotid bifurcations. Limited intracranial: Generalized cerebral atrophy noted. No acute intracranial process identified. IMPRESSION: No CT evidence for acute maxillofacial injury identified. Electronically Signed   By: Kim MuBenjamin  Schultz M.D.   On: 06/27/2016 00:41   [facility] shows patient got 120 hydrocodone 7.5 mg/325 filled on December 8. She had the same amount filled on September 7. She also gets alprazolam and Ambien on a regular basis.  Diagnoses that have been ruled out:  None  Diagnoses that are still under consideration:  None  Final diagnoses:  Toothache  Facial pain, acute   New Prescriptions   PENICILLIN V POTASSIUM (VEETID) 500 MG TABLET    Take 1 tablet (500 mg total) by mouth 4 (four) times daily.     Plan discharge  Kim AlbeIva Taleia Sadowski, MD, FACEP   I personally performed the services  described in this documentation, which was scribed in my presence. The recorded information has been reviewed and considered.  Kim AlbeIva Kellie Chisolm, MD, Concha PyoFACEP      Arwa Yero, MD 06/27/16 [phone]

## 2016-06-27 NOTE — ED Notes (Signed)
Pt aware urine sample is needed 

## 2016-06-27 NOTE — ED Provider Notes (Signed)
[facility]-EMERGENCY DEPT Provider Note   CSN: [id number] Arrival date & time: 06/26/16  2307     History   Chief Complaint Chief Complaint  Patient presents with  . Fall    HPI Kim Schultz is a 75 y.o. female.  Patient is a 75 year old female who presents to the emergency department for evaluation following a fall.  The patient states that on Wednesday, December 13 she lost her footing getting out of bed and fell. She states she injured her left face as well as the back of her head. She later told me that she had a bad tooth on the left side and she's not sure if the pain in her face is coming from the tooth, or from her fall. She did not lose consciousness. She did not lose control of bowels or bladder. She's been able to ambulate since the fall. She presents today because the pain is getting progressively worse and causing her to not feel well as well as become unbearable according to the patient. She denies chills or known fever. She's not had any blood in her urine. She's not been vomiting. She's not seeing any blood in her stool. Family has not noted any altered level of consciousness.   The history is provided by the patient.  Fall  Pertinent negatives include no chest pain, no abdominal pain and no shortness of breath.    Past Medical History:  Diagnosis Date  . Arthritis   . GERD (gastroesophageal reflux disease)   . Hypertension   . Thyroid disease     Patient Active Problem List   Diagnosis Date Noted  . Left knee pain 09/13/2015    Past Surgical History:  Procedure Laterality Date  . ABDOMINAL HYSTERECTOMY    . ELBOW SURGERY Left   . TONSILLECTOMY      OB History    No data available       Home Medications    Prior to Admission medications   Medication Sig Start Date End Date Taking? Authorizing Provider  cyclobenzaprine (FLEXERIL) 10 MG tablet Take 1 tablet (10 mg total) by mouth at bedtime. 06/14/16  Yes Darreld McleanWayne Keeling, MD  dicyclomine (BENTYL) 10  MG capsule Take 10 mg by mouth 4 (four) times daily -  before meals and at bedtime.   Yes Historical Provider, MD  HYDROcodone-acetaminophen (NORCO) 7.5-325 MG tablet Take 1 tablet by mouth every 4 (four) hours as needed for moderate pain (Must last 30 days.  Do not drive or operate machinery while taking this medicine.). 06/14/16  Yes Darreld McleanWayne Keeling, MD  levothyroxine (SYNTHROID, LEVOTHROID) 125 MCG tablet Take 125 mcg by mouth daily before breakfast.   Yes Historical Provider, MD  omeprazole (PRILOSEC) 20 MG capsule  07/22/15  Yes Historical Provider, MD  oxaprozin (DAYPRO) 600 MG tablet Take 1 tablet (600 mg total) by mouth 2 (two) times daily after a meal. 02/16/16  Yes Darreld McleanWayne Keeling, MD    Family History No family history on file.  Social History Social History  Substance Use Topics  . Smoking status: Never Smoker  . Smokeless tobacco: Never Used  . Alcohol use No     Allergies   Naprosyn [naproxen]   Review of Systems Review of Systems  Constitutional: Negative for activity change.       All ROS Neg except as noted in HPI  HENT: Positive for dental problem. Negative for nosebleeds.        Facial pain  Eyes: Negative for photophobia and discharge.  Respiratory: Negative for cough, shortness of breath, wheezing and stridor.   Cardiovascular: Negative for chest pain and palpitations.  Gastrointestinal: Negative for abdominal pain, blood in stool, diarrhea, nausea and vomiting.  Genitourinary: Negative for difficulty urinating, dysuria, frequency and hematuria.  Musculoskeletal: Positive for arthralgias and myalgias. Negative for back pain and neck pain.  Skin: Negative.   Neurological: Negative for dizziness, seizures and speech difficulty.  Psychiatric/Behavioral: Negative for confusion and hallucinations.     Physical Exam Updated Vital Signs BP 137/78   Pulse 82   Temp 101.2 F (38.4 C) (Rectal)   Resp 20   Ht 5\' 7"  (1.702 m)   Wt 83.9 kg   SpO2 94%   BMI 28.98  kg/m   Physical Exam  Constitutional: She is oriented to person, place, and time. She appears well-developed and well-nourished.  Non-toxic appearance.  Patient feels very warm to touch.  HENT:  Head: Normocephalic.    Right Ear: Tympanic membrane and external ear normal.  Left Ear: Tympanic membrane and external ear normal.  Eyes: EOM and lids are normal. Pupils are equal, round, and reactive to light.  Neck: Normal range of motion. Neck supple. Carotid bruit is not present.  Cardiovascular: Normal rate, regular rhythm, normal heart sounds, intact distal pulses and normal pulses.   Pulmonary/Chest: Effort normal and breath sounds normal. No respiratory distress. She has no wheezes.  There is symmetrical rise and fall of the chest. Patient speaks in complete sentences without problem.  Abdominal: Soft. Bowel sounds are normal. She exhibits no distension. There is no tenderness. There is no guarding.  Musculoskeletal: Normal range of motion. She exhibits tenderness. She exhibits no edema.  There is soreness of the right thigh and knee, no deformity appreciated. Capillary refill is less than 2 seconds. Radial pulses and dorsalis pedis pulses are 2+.  Lymphadenopathy:       Head (right side): No submandibular adenopathy present.       Head (left side): No submandibular adenopathy present.    She has no cervical adenopathy.  Neurological: She is alert and oriented to person, place, and time. She has normal strength. No cranial nerve deficit or sensory deficit.  Skin: Skin is warm and dry.  Psychiatric: She has a normal mood and affect. Her speech is normal.  Nursing note and vitals reviewed.    ED Treatments / Results  Labs (all labs ordered are listed, but only abnormal results are displayed) Labs Reviewed  CULTURE, BLOOD (ROUTINE X 2)  CULTURE, BLOOD (ROUTINE X 2)  CBC WITH DIFFERENTIAL/PLATELET  URINALYSIS, ROUTINE W REFLEX MICROSCOPIC  COMPREHENSIVE METABOLIC PANEL  LACTIC  ACID, PLASMA  LACTIC ACID, PLASMA    EKG  EKG Interpretation None       Radiology Ct Maxillofacial Wo Contrast  Result Date: 06/27/2016 CLINICAL DATA:  Initial evaluation for recent fall, now with acute left face pain. EXAM: CT MAXILLOFACIAL WITHOUT CONTRAST TECHNIQUE: Multidetector CT imaging of the maxillofacial structures was performed. Multiplanar CT image reconstructions were also generated. A small metallic BB was placed on the right temple in order to reliably differentiate right from left. COMPARISON:  None. FINDINGS: Osseous: Zygomatic arches intact. No acute maxillary fracture. Pterygoid plates intact. Nasal bones intact. Nasal septum mildly bowed to the right but intact. Mandible intact. Mandibular condyles normally situated. No acute abnormality about the dentition. Orbits: Globes intact. Patient is status post lens extraction bilaterally. No retro-orbital hematoma or other pathology. Bony orbits intact without evidence orbital floor fracture. Sinuses: Mild scattered  mucosal thickening within the anterior ethmoidal air cells. Paranasal sinuses are otherwise clear. Mastoids are clear. Middle ear cavities are clear. Soft tissues: No appreciable soft tissue swelling identified within the face. Scattered vascular calcifications noted about the carotid bifurcations. Limited intracranial: Generalized cerebral atrophy noted. No acute intracranial process identified. IMPRESSION: No CT evidence for acute maxillofacial injury identified. Electronically Signed   By: Rise MuBenjamin  McClintock M.D.   On: 06/27/2016 00:41    Procedures Procedures (including critical care time)  Medications Ordered in ED Medications  0.9 %  sodium chloride infusion (not administered)    Followed by  0.9 %  sodium chloride infusion (not administered)  HYDROcodone-acetaminophen (NORCO/VICODIN) 5-325 MG per tablet 2 tablet (2 tablets Oral Given 06/26/16 2353)  ondansetron (ZOFRAN) tablet 4 mg (4 mg Oral Given  06/26/16 2353)     Initial Impression / Assessment and Plan / ED Course  I have reviewed the triage vital signs and the nursing notes.  Pertinent labs & imaging results that were available during my care of the patient were reviewed by me and considered in my medical decision making (see chart for details).  Clinical Course   Pt seen with me by Dr. Lynelle DoctorKnapp.  **I have reviewed nursing notes, vital signs, and all appropriate lab and imaging results for this patient.*  Final Clinical Impressions(s) / ED Diagnoses  MDM Patient feels warm to touch. Rectal temperature has been ordered.  Rectal temperature is 101.2. Blood pressure is stable at 137/78. Pulse oximetry is 94% on room air.  Complete blood count is well within normal limits. CT scan of the maxillofacial area is negative for acute injury or findings.   Pt care to be continued by Dr Lynelle DoctorKnapp.   Final diagnoses:  Toothache  Facial pain, acute    New Prescriptions New Prescriptions   No medications on file     Ivery QualeHobson Nirvan Laban, PA-C 06/27/16 2235    Devoria AlbeIva Knapp, MD 06/29/16 2258

## 2016-06-27 NOTE — Discharge Instructions (Signed)
Take the antibiotic until gone. You can take ibuprofen 400 mg 3 times a day for pain in addition to your pain medications you already have. Use ice for comfort. You need to see a dentist about your tooth pain. Recheck if you get a fever, have difficulty breathing or swallowing.

## 2016-07-03 LAB — CULTURE, BLOOD (ROUTINE X 2)
CULTURE: NO GROWTH
CULTURE: NO GROWTH

## 2016-09-20 ENCOUNTER — Ambulatory Visit (INDEPENDENT_AMBULATORY_CARE_PROVIDER_SITE_OTHER): Payer: Medicare Other | Admitting: Orthopaedic Surgery

## 2016-09-20 VITALS — BP 199/98 | HR 89 | Ht 67.0 in | Wt 184.0 lb

## 2016-09-20 DIAGNOSIS — G8929 Other chronic pain: Secondary | ICD-10-CM | POA: Diagnosis not present

## 2016-09-20 DIAGNOSIS — M25562 Pain in left knee: Secondary | ICD-10-CM

## 2016-09-20 MED ORDER — HYDROCODONE-ACETAMINOPHEN 7.5-325 MG PO TABS: 1.0000 | ORAL_TABLET | ORAL | 0 refills | Status: DC | PRN

## 2016-09-20 NOTE — Progress Notes (Signed)
CC:  I have pain of my left knee. I would like an injection.  The patient has chronic pain of the left knee.  There is no recent trauma.  There is no redness.  Injections in the past have helped.  The knee has no redness, has an effusion and crepitus present.  ROM of the left knee is 0-105.  Impression:  Chronic knee pain left  Return: 3 months  PROCEDURE NOTE:  The patient requests injections of the left knee, verbal consent was obtained.  The left knee was prepped appropriately after time out was performed.   Sterile technique was observed and injection of 1 cc of Depo-Medrol 40 mg with several cc's of plain xylocaine. Anesthesia was provided by ethyl chloride and a 20-gauge needle was used to inject the knee area. The injection was tolerated well.  A band aid dressing was applied.  The patient was advised to apply ice later today and tomorrow to the injection sight as needed.  I have reviewed the [place] Cont[place] Substance Reporting System web site prior to prescribing narcotic medicine for this patient.  Electronically Signed Darreld McleanWayne Mason Burleigh, MD 3/15/20182:16 PM

## 2016-12-19 ENCOUNTER — Ambulatory Visit: Payer: Medicare Other | Admitting: Orthopaedic Surgery

## 2016-12-20 ENCOUNTER — Ambulatory Visit: Payer: Medicare Other | Admitting: Orthopaedic Surgery

## 2016-12-26 ENCOUNTER — Ambulatory Visit (INDEPENDENT_AMBULATORY_CARE_PROVIDER_SITE_OTHER): Payer: Medicare Other | Admitting: Orthopaedic Surgery

## 2016-12-26 ENCOUNTER — Encounter: Payer: Self-pay | Admitting: Orthopaedic Surgery

## 2016-12-26 VITALS — BP 197/95 | HR 88 | Temp 98.4°F | Ht 67.0 in | Wt 182.0 lb

## 2016-12-26 DIAGNOSIS — G8929 Other chronic pain: Secondary | ICD-10-CM | POA: Diagnosis not present

## 2016-12-26 DIAGNOSIS — M25522 Pain in left elbow: Secondary | ICD-10-CM | POA: Diagnosis not present

## 2016-12-26 DIAGNOSIS — M25562 Pain in left knee: Secondary | ICD-10-CM | POA: Diagnosis not present

## 2016-12-26 MED ORDER — HYDROCODONE-ACETAMINOPHEN 7.5-325 MG PO TABS: 1.0000 | ORAL_TABLET | ORAL | 0 refills | Status: DC | PRN

## 2016-12-26 NOTE — Progress Notes (Signed)
Patient [name], female DOB:[birth year], 76 y.o. UJW:[id number]  Chief Complaint  Patient presents with  . Follow-up    knee and elbow    HPI  Kim Schultz is a 76 y.o. female who has chronic left knee and elbow pain.  She has no new trauma, no swelling, no giving way of the knee.  Her elbow is stable. She is active.  She drives. HPI  Body mass index is 28.51 kg/m.  ROS  Review of Systems  Constitutional:       Patient does not have Diabetes Mellitus. Patient does not have hypertension. Patient does not have COPD or shortness of breath. Patient does not have BMI > 35. Patient does not have current smoking history.  HENT: Negative for congestion.   Respiratory: Negative for cough and shortness of breath.   Cardiovascular: Negative for chest pain.  Endocrine: Positive for cold intolerance.  Musculoskeletal: Positive for arthralgias, back pain, gait problem, joint swelling and myalgias.  Allergic/Immunologic: Positive for environmental allergies.  Psychiatric/Behavioral: The patient is nervous/anxious.     Past Medical History:  Diagnosis Date  . Arthritis   . GERD (gastroesophageal reflux disease)   . Hypertension   . Thyroid disease     Past Surgical History:  Procedure Laterality Date  . ABDOMINAL HYSTERECTOMY    . ELBOW SURGERY Left   . TONSILLECTOMY      History reviewed. No pertinent family history.  Social History Social History  Substance Use Topics  . Smoking status: Never Smoker  . Smokeless tobacco: Never Used  . Alcohol use No    Allergies  Allergen Reactions  . Naprosyn [Naproxen]     Current Outpatient Prescriptions  Medication Sig Dispense Refill  . cyclobenzaprine (FLEXERIL) 10 MG tablet Take 1 tablet (10 mg total) by mouth at bedtime. 90 tablet 3  . dicyclomine (BENTYL) 10 MG capsule Take 10 mg by mouth 4 (four) times daily -  before meals and at bedtime.    Marland Kitchen HYDROcodone-acetaminophen (NORCO) 7.5-325 MG tablet Take 1 tablet  by mouth every 4 (four) hours as needed for moderate pain (Must last 30 days.  Do not drive or operate machinery while taking this medicine.). 120 tablet 0  . levothyroxine (SYNTHROID, LEVOTHROID) 125 MCG tablet Take 125 mcg by mouth daily before breakfast.    . omeprazole (PRILOSEC) 20 MG capsule     . oxaprozin (DAYPRO) 600 MG tablet Take 1 tablet (600 mg total) by mouth 2 (two) times daily after a meal. 60 tablet 5   No current facility-administered medications for this visit.      Physical Exam  Blood pressure (!) 197/95, pulse 88, temperature 98.4 F (36.9 C), height 5\' 7"  (1.702 m), weight 182 lb (82.6 kg).  Constitutional: overall normal hygiene, normal nutrition, well developed, normal grooming, normal body habitus. Assistive device:none  Musculoskeletal: gait and station Limp left, muscle tone and strength are normal, no tremors or atrophy is present.  .  Neurological: coordination overall normal.  Deep tendon reflex/nerve stretch intact.  Sensation normal.  Cranial nerves II-XII intact.   Skin:   Normal overall no scars, lesions, ulcers or rashes. No psoriasis.  Psychiatric: Alert and oriented x 3.  Recent memory intact, remote memory unclear.  Normal mood and affect. Well groomed.  Good eye contact.  Cardiovascular: overall no swelling, no varicosities, no edema bilaterally, normal temperatures of the legs and arms, no clubbing, cyanosis and good capillary refill.  Lymphatic: palpation is normal.  The left lower  extremity is examined:  Inspection:  Thigh:  Non-tender and no defects  Knee has no swelling 0 effusion.                        Joint tenderness is present                        Patient is tender over the medial joint line  Lower Leg:  Has normal appearance and no tenderness or defects  Ankle:  Non-tender and no defects  Foot:  Non-tender and no defects Range of Motion:  Knee:  Range of motion is: 0-110                        Crepitus is  present  Ankle:   Range of motion is normal. Strength and Tone:  The left lower extremity has normal strength and tone. Stability:  Knee:  The knee is stable.  Ankle:  The ankle is stable.    The patient has been educated about the nature of the problem(s) and counseled on treatment options.  The patient appeared to understand what I have discussed and is in agreement with it.  Encounter Diagnoses  Name Primary?  . Chronic pain of left knee Yes  . Elbow pain, chronic, left     PLAN Call if any problems.  Precautions discussed.  Continue current medications.   Return to clinic 3 months   I have reviewed the [facility] [place] [facility] [place] [facility] web site prior to prescribing narcotic medicine for this patient.  Electronically Signed Darreld McleanWayne Pecola Haxton, MD 6/20/20182:09 PM

## 2017-03-27 ENCOUNTER — Ambulatory Visit: Payer: Medicare Other | Admitting: Orthopaedic Surgery

## 2017-04-10 ENCOUNTER — Ambulatory Visit (INDEPENDENT_AMBULATORY_CARE_PROVIDER_SITE_OTHER): Payer: Medicare Other | Admitting: Orthopaedic Surgery

## 2017-04-10 ENCOUNTER — Encounter: Payer: Self-pay | Admitting: Orthopaedic Surgery

## 2017-04-10 VITALS — BP 172/91 | HR 90 | Temp 98.9°F | Ht 67.0 in | Wt 192.0 lb

## 2017-04-10 DIAGNOSIS — M25522 Pain in left elbow: Secondary | ICD-10-CM | POA: Diagnosis not present

## 2017-04-10 DIAGNOSIS — M25562 Pain in left knee: Secondary | ICD-10-CM | POA: Diagnosis not present

## 2017-04-10 DIAGNOSIS — G8929 Other chronic pain: Secondary | ICD-10-CM

## 2017-04-10 MED ORDER — HYDROCODONE-ACETAMINOPHEN 7.5-325 MG PO TABS: 1.0000 | ORAL_TABLET | ORAL | 0 refills | Status: DC | PRN

## 2017-04-10 MED ORDER — DICYCLOMINE HCL 10 MG PO CAPS: 10.0000 mg | ORAL_CAPSULE | Freq: Three times a day (TID) | ORAL | 3 refills | Status: DC

## 2017-04-10 NOTE — Progress Notes (Signed)
Patient Kim Schultz, female DOB:11-15-1940, 76 y.o. PXT:[id number]  Chief Complaint  Patient presents with  . Follow-up    Chronic pain left knee and elbow    HPI  Kim Schultz is a 76 y.o. female who has chronic pain of the left elbow and the left knee.  Both are stable.  She has no new trauma, no redness.  She has some swelling of the left knee but no giving way. HPI  Body mass index is 30.07 kg/m.  ROS  Review of Systems  Constitutional:       Patient does not have Diabetes Mellitus. Patient does not have hypertension. Patient does not have COPD or shortness of breath. Patient does not have BMI > 35. Patient does not have current smoking history.  HENT: Negative for congestion.   Respiratory: Negative for cough and shortness of breath.   Cardiovascular: Negative for chest pain.  Endocrine: Positive for cold intolerance.  Musculoskeletal: Positive for arthralgias, back pain, gait problem, joint swelling and myalgias.  Allergic/Immunologic: Positive for environmental allergies.  Psychiatric/Behavioral: The patient is nervous/anxious.     Past Medical History:  Diagnosis Date  . Arthritis   . GERD (gastroesophageal reflux disease)   . Hypertension   . Thyroid disease     Past Surgical History:  Procedure Laterality Date  . ABDOMINAL HYSTERECTOMY    . ELBOW SURGERY Left   . TONSILLECTOMY      History reviewed. No pertinent family history.  Social History Social History  Substance Use Topics  . Smoking status: Never Smoker  . Smokeless tobacco: Never Used  . Alcohol use No    Allergies  Allergen Reactions  . Naprosyn [Naproxen]     Current Outpatient Prescriptions  Medication Sig Dispense Refill  . cyclobenzaprine (FLEXERIL) 10 MG tablet Take 1 tablet (10 mg total) by mouth at bedtime. 90 tablet 3  . dicyclomine (BENTYL) 10 MG capsule Take 10 mg by mouth 4 (four) times daily -  before meals and at bedtime.    . dicyclomine (BENTYL) 10 MG  capsule Take 1 capsule (10 mg total) by mouth 4 (four) times daily -  before meals and at bedtime. 90 capsule 3  . HYDROcodone-acetaminophen (NORCO) 7.5-325 MG tablet Take 1 tablet by mouth every 4 (four) hours as needed for moderate pain (Must last 30 days.  Do not drive or operate machinery while taking this medicine.). 120 tablet 0  . levothyroxine (SYNTHROID, LEVOTHROID) 125 MCG tablet Take 125 mcg by mouth daily before breakfast.    . omeprazole (PRILOSEC) 20 MG capsule     . oxaprozin (DAYPRO) 600 MG tablet Take 1 tablet (600 mg total) by mouth 2 (two) times daily after a meal. 60 tablet 5   No current facility-administered medications for this visit.      Physical Exam  Blood pressure (!) 172/91, pulse 90, temperature 98.9 F (37.2 C), height  (1.702 m), weight 192 lb (87.1 kg).  Constitutional: overall normal hygiene, normal nutrition, well developed, normal grooming, normal body habitus. Assistive device:none  Musculoskeletal: gait and station Limp left, muscle tone and strength are normal, no tremors or atrophy is present.  .  Neurological: coordination overall normal.  Deep tendon reflex/nerve stretch intact.  Sensation normal.  Cranial nerves II-XII intact.   Skin:   Normal overall no scars, lesions, ulcers or rashes. No psoriasis.  Psychiatric: Alert and oriented x 3.  Recent memory intact, remote memory unclear.  Normal mood and affect. Well groomed.  Good eye contact.  Cardiovascular: overall no swelling, no varicosities, no edema bilaterally, normal temperatures of the legs and arms, no clubbing, cyanosis and good capillary refill.  Lymphatic: palpation is normal.  All other systems reviewed and are negative   Left knee has some slight effusion, ROM is 0 to 110, the knee is stable, NV intact, pain over medial joint line.  Slight limp to the left.  The left elbow has tenderness, lacks full extension by 5 degrees, NV intact.  Grips are normal.  The patient has  been educated about the nature of the problem(s) and counseled on treatment options.  The patient appeared to understand what I have discussed and is in agreement with it.  Encounter Diagnoses  Name Primary?  . Chronic pain of left knee Yes  . Elbow pain, chronic, left     PLAN Call if any problems.  Precautions discussed.  Continue current medications.   Return to clinic 3 months   Electronically Signed Darreld Mclean, MD 10/3/20182:31 PM

## 2017-04-25 ENCOUNTER — Ambulatory Visit (INDEPENDENT_AMBULATORY_CARE_PROVIDER_SITE_OTHER): Payer: Medicare Other | Admitting: Family Medicine

## 2017-04-25 ENCOUNTER — Encounter: Payer: Self-pay | Admitting: Family Medicine

## 2017-04-25 VITALS — BP 160/86 | HR 90 | Ht 67.0 in | Wt 192.0 lb

## 2017-04-25 DIAGNOSIS — F13988 Sedative, hypnotic or anxiolytic use, unspecified with other sedative, hypnotic or anxiolytic-induced disorder: Secondary | ICD-10-CM

## 2017-04-25 DIAGNOSIS — Z9181 History of falling: Secondary | ICD-10-CM

## 2017-04-25 DIAGNOSIS — Z79899 Other long term (current) drug therapy: Secondary | ICD-10-CM

## 2017-04-25 NOTE — Progress Notes (Signed)
Here as a new patient to get refills on xanax and ambien and to establish care. Reports that was seen by Dr. Megan MansMcGinnis for over 50 years. Is also followed by Dr. Hilda LiasKeeling and takes hydrocodone 7.5 mg as needed for her arthritis.   Reports that she cannot sleep without all of her medications and has been under a lot of stress b/c she has to help out her family. She has reportedly been on SSRI in the past for anxiety but did not help like the xanax helps. She does not want to see ps  Reports that she is under a lot of stress and it would not do her any good to see behavioral health.  Reports that she needs her xanax and ambien to sleep. Does not want to try any other medications or decrease use of the medications.   She had a fall within the past two years and broke her elbow, requiring surgery.   Patient reports that she will just go back and see Dr. Elmyra RicksKim's office so that she can get her medications that she needs for her sleep.    I discussed with patient that her current use of narcotics, benzodiazepines, and sedative/hypnotics were not good management for her anxiety and sleep disturbance. I discussed with her that she is at a substantial risk of falling due to these medications. In addition, she is at risk for other problems related to these medications. She reports that she is fine with the risk but wants her medicine and does not want to try any other medications or see a therapist/psychiatrist. She reports that she does not have the time or money to deal with it. She was very pleasant throughout the encounter, but does not wish to be seen in our office or establish care here b/c I am not willing to continue her course of medications.   She will see care at previous PCP.  Janine Limboachel H. Tracie HarrierHagler, MD

## 2017-04-25 NOTE — Progress Notes (Deleted)
    Patient ID: Kim PatersonMargaret F Schultz, female    DOB: 07/18/1940, 76 y.o.   MRN: [id number]  Chief Complaint  Patient presents with  . Establish Care    new patient    Allergies Naprosyn [naproxen]  Subjective:   Kim PatersonMargaret F Schultz is a 76 y.o. female who [facility].  HPI Here to establish care. Reports that was seen by Dr. Megan MansMcGinnis for over  50 years. Then continued to go the clinic, but reports taht she would wait for hours to be seen and wanted to switch to our office. Is followed by Dr. Hilda LiasKeeling for arthritis problems.   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 Was followed by dermatologist and got shots in scalp and took medications for years, but it did not help. Reports that as a result of it, lost eye brows and all of hair.   Reports that she is under a lot of stress and it would not do her any good to see behavioral health. Reports that had to give her savings to help with her daughter and pay for her home. Reports that daughter has not been able to pay her back. Reports that has trouble with her son b/c he is terrible with money management.  Reports that has had a lot of stress in her life. Reports that she needs her xanax and ambien to sleep.   She believes that she cannot sleep well b/c her mother did not get     Past Medical History:  Diagnosis Date  . Arthritis   . GERD (gastroesophageal reflux disease)   . Hypertension   . Thyroid disease     Past Surgical History:  Procedure Laterality Date  . ABDOMINAL HYSTERECTOMY    . ELBOW SURGERY Left   . TONSILLECTOMY      No family history on file.   Social History   Social History  . Marital status: Widowed    Spouse name: N/A  . Number of children: N/A  . Years of education: N/A   Social History Main Topics  . Smoking status: Never Smoker  . Smokeless tobacco: Never Used  . Alcohol use No  . Drug use: No  . Sexual activity: No   Other Topics  Concern  . None   Social History Narrative  . None    Review of Systems   Objective:   BP (!) 160/86   Pulse 90   Ht 5\' 7"  (1.702 m)   Wt 192 lb (87.1 kg)   SpO2 97%   BMI 30.07 kg/m   Physical Exam   Assessment and Plan   There are no diagnoses linked to this encounter.   No Follow-up on file. Aliene Beamsachel Giamarie Bueche, MD 04/25/2017

## 2017-05-14 ENCOUNTER — Ambulatory Visit: Payer: Medicare Other | Admitting: Family Medicine

## 2017-06-25 ENCOUNTER — Other Ambulatory Visit: Payer: Self-pay | Admitting: Orthopaedic Surgery

## 2017-07-11 ENCOUNTER — Ambulatory Visit (INDEPENDENT_AMBULATORY_CARE_PROVIDER_SITE_OTHER): Payer: Medicare Other | Admitting: Orthopaedic Surgery

## 2017-07-11 ENCOUNTER — Encounter: Payer: Self-pay | Admitting: Orthopaedic Surgery

## 2017-07-11 VITALS — BP 180/101 | HR 104 | Ht 67.0 in | Wt 187.0 lb

## 2017-07-11 DIAGNOSIS — M25562 Pain in left knee: Secondary | ICD-10-CM

## 2017-07-11 DIAGNOSIS — M25522 Pain in left elbow: Secondary | ICD-10-CM | POA: Diagnosis not present

## 2017-07-11 DIAGNOSIS — G8929 Other chronic pain: Secondary | ICD-10-CM | POA: Diagnosis not present

## 2017-07-11 MED ORDER — HYDROCODONE-ACETAMINOPHEN 7.5-325 MG PO TABS: 1.0000 | ORAL_TABLET | ORAL | 0 refills | Status: DC | PRN

## 2017-07-11 NOTE — Progress Notes (Signed)
Patient WU:JWJXBJYN:Kim Schultz, female DOB:01/10/1941, 77 y.o. [id number]  Chief Complaint  Patient presents with  . Knee Pain    left  . Elbow Pain    left    HPI  Kim Schultz is a 77 y.o. female who has chronic left elbow pain and knee pain on the left.  She has increased pain in the cold weather but has no new trauma.  She has no redness or weakness or giving way.  She is taking her medicine. HPI  Body mass index is 29.29 kg/m.  ROS  Review of Systems  Constitutional:       Patient does not have Diabetes Mellitus. Patient does not have hypertension. Patient does not have COPD or shortness of breath. Patient does not have BMI > 35. Patient does not have current smoking history.  HENT: Negative for congestion.   Respiratory: Negative for cough and shortness of breath.   Cardiovascular: Negative for chest pain.  Endocrine: Positive for cold intolerance.  Musculoskeletal: Positive for arthralgias, back pain, gait problem, joint swelling and myalgias.  Allergic/Immunologic: Positive for environmental allergies.  Psychiatric/Behavioral: The patient is nervous/anxious.   All other systems reviewed and are negative.   Past Medical History:  Diagnosis Date  . Arthritis   . GERD (gastroesophageal reflux disease)   . Hypertension   . Thyroid disease     Past Surgical History:  Procedure Laterality Date  . ABDOMINAL HYSTERECTOMY    . ELBOW SURGERY Left   . TONSILLECTOMY      History reviewed. No pertinent family history.  Social History Social History   Tobacco Use  . Smoking status: Never Smoker  . Smokeless tobacco: Never Used  Substance Use Topics  . Alcohol use: No  . Drug use: No    Allergies  Allergen Reactions  . Naprosyn [Naproxen]     Current Outpatient Medications  Medication Sig Dispense Refill  . ALPRAZolam (XANAX) 0.5 MG tablet Take 0.5 mg by mouth 3 (three) times daily.    . cyclobenzaprine (FLEXERIL) 10 MG tablet TAKE ONE TABLET BY  MOUTH ONCE DAILY AT BEDTIME 30 tablet 3  . dicyclomine (BENTYL) 10 MG capsule Take 1 capsule (10 mg total) by mouth 4 (four) times daily -  before meals and at bedtime. 90 capsule 3  . HYDROcodone-acetaminophen (NORCO) 7.5-325 MG tablet Take 1 tablet by mouth every 4 (four) hours as needed for moderate pain (Must last 30 days.  Do not drive or operate machinery while taking this medicine.). 120 tablet 0  . levothyroxine (SYNTHROID, LEVOTHROID) 125 MCG tablet Take 125 mcg by mouth daily before breakfast.    . omeprazole (PRILOSEC) 20 MG capsule     . zolpidem (AMBIEN) 10 MG tablet Take 10 mg by mouth at bedtime as needed for sleep.     No current facility-administered medications for this visit.      Physical Exam  Blood pressure (!) 180/101, pulse (!) 104, height 5\' 7"  (1.702 m), weight 187 lb (84.8 kg).  Constitutional: overall normal hygiene, normal nutrition, well developed, normal grooming, normal body habitus. Assistive device:none  Musculoskeletal: gait and station Limp left, muscle tone and strength are normal, no tremors or atrophy is present.  .  Neurological: coordination overall normal.  Deep tendon reflex/nerve stretch intact.  Sensation normal.  Cranial nerves II-XII intact.   Skin:   Normal overall no scars, lesions, ulcers or rashes. No psoriasis.  Psychiatric: Alert and oriented x 3.  Recent memory intact, remote memory unclear.  Normal mood and affect. Well groomed.  Good eye contact.  Cardiovascular: overall no swelling, no varicosities, no edema bilaterally, normal temperatures of the legs and arms, no clubbing, cyanosis and good capillary refill.  Lymphatic: palpation is normal.  All other systems reviewed and are negative   Left elbow Schultz full extension by 5 degrees and has crepitus.  NV intact.  The left lower extremity is examined:  Inspection:  Thigh:  Non-tender and no defects  Knee has swelling 1+ effusion.                        Joint tenderness is  present                        Patient is tender over the medial joint line  Lower Leg:  Has normal appearance and no tenderness or defects  Ankle:  Non-tender and no defects  Foot:  Non-tender and no defects Range of Motion:  Knee:  Range of motion is: 0-105                        Crepitus is  present  Ankle:  Range of motion is normal. Strength and Tone:  The left lower extremity has normal strength and tone. Stability:  Knee:  The knee is stable.  Ankle:  The ankle is stable.   The patient has been educated about the nature of the problem(s) and counseled on treatment options.  The patient appeared to understand what I have discussed and is in agreement with it.  Encounter Diagnoses  Name Primary?  . Chronic pain of left knee Yes  . Elbow pain, chronic, left     PLAN Call if any problems.  Precautions discussed.  Continue current medications.   Return to clinic 4 months.   Electronically Signed Darreld Mclean, MD 1/3/20192:35 PM

## 2017-10-03 ENCOUNTER — Other Ambulatory Visit: Payer: Self-pay | Admitting: Orthopaedic Surgery

## 2017-10-30 ENCOUNTER — Other Ambulatory Visit: Payer: Self-pay | Admitting: Orthopaedic Surgery

## 2017-10-31 ENCOUNTER — Other Ambulatory Visit: Payer: Self-pay | Admitting: Orthopaedic Surgery

## 2017-10-31 NOTE — Telephone Encounter (Signed)
Cyclobenzaprine (FLEXERIL) 10 MG Tablet  Qty  30 Tablets  TAKE ONE TABLET BY MOUTH ONCE DAILY AT BEDTIME   PATIENT USES [place] [facility]  Patient is asking for a 3 month supply instead of 1 month. Her pharmacy sent a request to Dr. Hilda LiasKeeling yesterday and she called to check about it since they had not heard anything.

## 2017-11-01 MED ORDER — CYCLOBENZAPRINE HCL 10 MG PO TABS: 10.0000 mg | ORAL_TABLET | Freq: Every day | ORAL | 0 refills | Status: DC

## 2017-11-12 ENCOUNTER — Ambulatory Visit: Payer: Medicare Other | Admitting: Orthopaedic Surgery

## 2017-11-19 ENCOUNTER — Ambulatory Visit (INDEPENDENT_AMBULATORY_CARE_PROVIDER_SITE_OTHER): Payer: Medicare Other | Admitting: Orthopaedic Surgery

## 2017-11-19 ENCOUNTER — Encounter: Payer: Self-pay | Admitting: Orthopaedic Surgery

## 2017-11-19 VITALS — BP 186/100 | HR 90 | Ht 67.0 in | Wt 187.0 lb

## 2017-11-19 DIAGNOSIS — M25522 Pain in left elbow: Secondary | ICD-10-CM | POA: Diagnosis not present

## 2017-11-19 DIAGNOSIS — M25562 Pain in left knee: Secondary | ICD-10-CM

## 2017-11-19 DIAGNOSIS — G8929 Other chronic pain: Secondary | ICD-10-CM

## 2017-11-19 MED ORDER — CYCLOBENZAPRINE HCL 10 MG PO TABS: ORAL_TABLET | ORAL | 2 refills | Status: DC

## 2017-11-19 MED ORDER — HYDROCODONE-ACETAMINOPHEN 7.5-325 MG PO TABS: 1.0000 | ORAL_TABLET | ORAL | 0 refills | Status: DC | PRN

## 2017-11-19 MED ORDER — HYDROCODONE-ACETAMINOPHEN 7.5-325 MG PO TABS: ORAL_TABLET | ORAL | 0 refills | Status: DC

## 2017-11-19 NOTE — Progress Notes (Signed)
Patient ZO:XWRUEAVW KEYSHAWNA Schultz, Schultz DOB:[birth year], 77 y.o. UJW:[id number]  Chief Complaint  Patient presents with  . Follow-up    Chronic pain left knee and elbow    HPI  Kim Schultz who has chronic pain of the left elbow and of the left knee.  She is doing fairly well.  Her elbow is bothering her most nights.  She has no new trauma.  Her knee is tender more with activity. She has no giving away but does have swelling and popping.  She is taking her medicine. HPI  Body mass index is 29.29 kg/m.  ROS  Review of Systems  Constitutional:       Patient does not have Diabetes Mellitus. Patient does not have hypertension. Patient does not have COPD or shortness of breath. Patient does not have BMI > 35. Patient does not have current smoking history.  HENT: Negative for congestion.   Respiratory: Negative for cough and shortness of breath.   Cardiovascular: Negative for chest pain.  Endocrine: Positive for cold intolerance.  Musculoskeletal: Positive for arthralgias, back pain, gait problem, joint swelling and myalgias.  Allergic/Immunologic: Positive for environmental allergies.  Psychiatric/Behavioral: The patient is nervous/anxious.   All other systems reviewed and are negative.   Past Medical History:  Diagnosis Date  . Arthritis   . GERD (gastroesophageal reflux disease)   . Hypertension   . Thyroid disease     Past Surgical History:  Procedure Laterality Date  . ABDOMINAL HYSTERECTOMY    . ELBOW SURGERY Left   . TONSILLECTOMY      History reviewed. No pertinent family history.  Social History Social History   Tobacco Use  . Smoking status: Never Smoker  . Smokeless tobacco: Never Used  Substance Use Topics  . Alcohol use: No  . Drug use: No    Allergies  Allergen Reactions  . Naprosyn [Naproxen]     Current Outpatient Medications  Medication Sig Dispense Refill  . ALPRAZolam (XANAX) 0.5 MG tablet Take 0.5 mg by mouth 3 (three)  times daily.    . cyclobenzaprine (FLEXERIL) 10 MG tablet One tablet twice a day as needed for spasm 60 tablet 2  . dicyclomine (BENTYL) 10 MG capsule Take 1 capsule (10 mg total) by mouth 4 (four) times daily -  before meals and at bedtime. 90 capsule 3  . HYDROcodone-acetaminophen (NORCO) 7.5-325 MG tablet Take 1 tablet by mouth every 4 (four) hours as needed for moderate pain (Must last 30 days.  Do not drive or operate machinery while taking this medicine.). 120 tablet 0  . HYDROcodone-acetaminophen (NORCO) 7.5-325 MG tablet TAKE (1) TABLET BY MOUTH EVERY 4 HOURS AS NEEDED FOR PAIN. MUST LAST 30 DAYS. 120 tablet 0  . levothyroxine (SYNTHROID, LEVOTHROID) 125 MCG tablet Take 125 mcg by mouth daily before breakfast.    . omeprazole (PRILOSEC) 20 MG capsule     . zolpidem (AMBIEN) 10 MG tablet Take 10 mg by mouth at bedtime as needed for sleep.     No current facility-administered medications for this visit.      Physical Exam  Blood pressure (!) 186/100, pulse 90, height  (1.702 m), weight 187 lb (84.8 kg).  Constitutional: overall normal hygiene, normal nutrition, well developed, normal grooming, normal body habitus. Assistive device:none  Musculoskeletal: gait and station Limp left, muscle tone and strength are normal, no tremors or atrophy is present.  .  Neurological: coordination overall normal.  Deep tendon reflex/nerve stretch intact.  Sensation normal.  Cranial nerves II-XII intact.   Skin:   Normal overall no scars, lesions, ulcers or rashes. No psoriasis.  Psychiatric: Alert and oriented x 3.  Recent memory intact, remote memory unclear.  Normal mood and affect. Well groomed.  Good eye contact.  Cardiovascular: overall no swelling, no varicosities, no edema bilaterally, normal temperatures of the legs and arms, no clubbing, cyanosis and good capillary refill.  Lymphatic: palpation is normal.  Left elbow lacks full extension by 7 degrees.  NV intact.  The left lower  extremity is examined:  Inspection:  Thigh:  Non-tender and no defects  Knee has swelling 1+ effusion.                        Joint tenderness is present                        Patient is tender over the medial joint line  Lower Leg:  Has normal appearance and no tenderness or defects  Ankle:  Non-tender and no defects  Foot:  Non-tender and no defects Range of Motion:  Knee:  Range of motion is: 0-105                        Crepitus is  present  Ankle:  Range of motion is normal. Strength and Tone:  The left lower extremity has normal strength and tone. Stability:  Knee:  The knee is stable.  Ankle:  The ankle is stable.   All other systems reviewed and are negative   The patient has been educated about the nature of the problem(s) and counseled on treatment options.  The patient appeared to understand what I have discussed and is in agreement with it.  Encounter Diagnoses  Name Primary?  . Chronic pain of left knee Yes  . Elbow pain, chronic, left     PLAN Call if any problems.  Precautions discussed.  Continue current medications.   Return to clinic 3 months   I have reviewed the [facility] Controlled Substance Reporting System web site prior to prescribing narcotic medicine for this patient.  Electronically Signed Darreld Mclean, MD 5/14/20192:11 PM

## 2017-11-20 ENCOUNTER — Telehealth: Payer: Self-pay | Admitting: Orthopaedic Surgery

## 2017-11-20 MED ORDER — HYDROCODONE-ACETAMINOPHEN 7.5-325 MG PO TABS: ORAL_TABLET | ORAL | 0 refills | Status: DC

## 2017-11-20 NOTE — Telephone Encounter (Signed)
Patient called to relay she wants to use [place] only as her pharmacy.  States she did get her Flexeril from [facility], but will not be using them in future due to insurance and cost. States that she was told by [employer] that she can get her pain medication Norco 7.5/325, as of 11/24/17.

## 2017-11-21 NOTE — Telephone Encounter (Signed)
[facility] is what shows in my list

## 2017-12-12 ENCOUNTER — Encounter: Payer: Self-pay | Admitting: Family Medicine

## 2017-12-13 ENCOUNTER — Encounter: Payer: Self-pay | Admitting: Family Medicine

## 2018-01-29 ENCOUNTER — Other Ambulatory Visit: Payer: Self-pay

## 2018-01-29 ENCOUNTER — Encounter (HOSPITAL_COMMUNITY): Payer: Self-pay | Admitting: Emergency Medicine

## 2018-01-29 ENCOUNTER — Observation Stay (HOSPITAL_COMMUNITY)
Admission: EM | Admit: 2018-01-29 | Discharge: 2018-01-30 | Disposition: A | Discharge status: Home / Self Care | Payer: Medicare Other | Attending: Internal Medicine | Admitting: Internal Medicine

## 2018-01-29 DIAGNOSIS — Z79899 Other long term (current) drug therapy: Secondary | ICD-10-CM | POA: Diagnosis not present

## 2018-01-29 DIAGNOSIS — K219 Gastro-esophageal reflux disease without esophagitis: Secondary | ICD-10-CM

## 2018-01-29 DIAGNOSIS — N183 Chronic kidney disease, stage 3 unspecified: Secondary | ICD-10-CM

## 2018-01-29 DIAGNOSIS — R6883 Chills (without fever): Secondary | ICD-10-CM | POA: Insufficient documentation

## 2018-01-29 DIAGNOSIS — R112 Nausea with vomiting, unspecified: Principal | ICD-10-CM

## 2018-01-29 DIAGNOSIS — I10 Essential (primary) hypertension: Secondary | ICD-10-CM

## 2018-01-29 DIAGNOSIS — R61 Generalized hyperhidrosis: Secondary | ICD-10-CM | POA: Insufficient documentation

## 2018-01-29 DIAGNOSIS — D72829 Elevated white blood cell count, unspecified: Secondary | ICD-10-CM | POA: Insufficient documentation

## 2018-01-29 DIAGNOSIS — E079 Disorder of thyroid, unspecified: Secondary | ICD-10-CM | POA: Insufficient documentation

## 2018-01-29 DIAGNOSIS — N179 Acute kidney failure, unspecified: Secondary | ICD-10-CM | POA: Diagnosis not present

## 2018-01-29 DIAGNOSIS — R197 Diarrhea, unspecified: Secondary | ICD-10-CM | POA: Diagnosis not present

## 2018-01-29 DIAGNOSIS — E039 Hypothyroidism, unspecified: Secondary | ICD-10-CM

## 2018-01-29 LAB — URINALYSIS, ROUTINE W REFLEX MICROSCOPIC
BACTERIA UA: NONE SEEN
Bilirubin Urine: NEGATIVE
GLUCOSE, UA: NEGATIVE mg/dL
KETONES UR: NEGATIVE mg/dL
NITRITE: NEGATIVE
Protein, ur: 30 mg/dL — AB
Specific Gravity, Urine: 1.012 (ref 1.005–1.030)
pH: 5 (ref 5.0–8.0)

## 2018-01-29 LAB — COMPREHENSIVE METABOLIC PANEL
ALBUMIN: 4.5 g/dL (ref 3.5–5.0)
ALT: 11 U/L (ref 0–44)
ANION GAP: 14 (ref 5–15)
AST: 26 U/L (ref 15–41)
Alkaline Phosphatase: 94 U/L (ref 38–126)
BUN: 24 mg/dL — AB (ref 8–23)
CHLORIDE: 105 mmol/L (ref 98–111)
CO2: 22 mmol/L (ref 22–32)
Calcium: 9.9 mg/dL (ref 8.9–10.3)
Creatinine, Ser: 1.7 mg/dL — ABNORMAL HIGH (ref 0.44–1.00)
GFR calc Af Amer: 33 mL/min — ABNORMAL LOW (ref >60)
GFR calc non Af Amer: 28 mL/min — ABNORMAL LOW (ref >60)
GLUCOSE: 162 mg/dL — AB (ref 70–99)
POTASSIUM: 4 mmol/L (ref 3.5–5.1)
SODIUM: 141 mmol/L (ref 135–145)
Total Bilirubin: 0.8 mg/dL (ref 0.3–1.2)
Total Protein: 8.3 g/dL — ABNORMAL HIGH (ref 6.5–8.1)

## 2018-01-29 LAB — DIFFERENTIAL
BASOS ABS: 0 10*3/uL (ref 0.0–0.1)
Basophils Relative: 0 %
Eosinophils Absolute: 0 10*3/uL (ref 0.0–0.7)
Eosinophils Relative: 0 %
LYMPHS ABS: 1.2 10*3/uL (ref 0.7–4.0)
LYMPHS PCT: 5 %
MONOS PCT: 6 %
Monocytes Absolute: 1.3 10*3/uL — ABNORMAL HIGH (ref 0.1–1.0)
NEUTROS ABS: 19 10*3/uL — AB (ref 1.7–7.7)
Neutrophils Relative %: 89 %

## 2018-01-29 LAB — LIPASE, BLOOD: LIPASE: 26 U/L (ref 11–51)

## 2018-01-29 LAB — CBC
HCT: 46.9 % — ABNORMAL HIGH (ref 36.0–46.0)
HEMOGLOBIN: 15.2 g/dL — AB (ref 12.0–15.0)
MCH: 31.4 pg (ref 26.0–34.0)
MCHC: 32.4 g/dL (ref 30.0–36.0)
MCV: 96.9 fL (ref 78.0–100.0)
Platelets: 230 10*3/uL (ref 150–400)
RBC: 4.84 MIL/uL (ref 3.87–5.11)
RDW: 12.8 % (ref 11.5–15.5)
WBC: 20.9 10*3/uL — ABNORMAL HIGH (ref 4.0–10.5)

## 2018-01-29 LAB — PROCALCITONIN: Procalcitonin: 0.25 ng/mL

## 2018-01-29 MED ORDER — SODIUM CHLORIDE 0.9 % IV BOLUS
1000.0000 mL | Freq: Once | INTRAVENOUS | Status: AC
  Administered 2018-01-29: 1000 mL via INTRAVENOUS

## 2018-01-29 MED ORDER — ACETAMINOPHEN 650 MG RE SUPP: 650.0000 mg | Freq: Four times a day (QID) | RECTAL | Status: DC | PRN

## 2018-01-29 MED ORDER — FAMOTIDINE IN NACL 20-0.9 MG/50ML-% IV SOLN
20.0000 mg | Freq: Every day | INTRAVENOUS | Status: DC
  Administered 2018-01-29 – 2018-01-30 (×2): 20 mg via INTRAVENOUS
  Filled 2018-01-29 (×2): qty 50

## 2018-01-29 MED ORDER — HYDROCODONE-ACETAMINOPHEN 7.5-325 MG PO TABS: 1.0000 | ORAL_TABLET | Freq: Four times a day (QID) | ORAL | Status: DC | PRN

## 2018-01-29 MED ORDER — ONDANSETRON HCL 4 MG/2ML IJ SOLN
4.0000 mg | Freq: Once | INTRAMUSCULAR | Status: AC
  Administered 2018-01-29: 4 mg via INTRAVENOUS
  Filled 2018-01-29: qty 2

## 2018-01-29 MED ORDER — LOPERAMIDE HCL 2 MG PO CAPS
4.0000 mg | ORAL_CAPSULE | Freq: Once | ORAL | Status: AC
  Administered 2018-01-29: 4 mg via ORAL
  Filled 2018-01-29: qty 2

## 2018-01-29 MED ORDER — ALPRAZOLAM 0.5 MG PO TABS
0.5000 mg | ORAL_TABLET | Freq: Three times a day (TID) | ORAL | Status: DC
  Administered 2018-01-29 – 2018-01-30 (×4): 0.5 mg via ORAL
  Filled 2018-01-29 (×4): qty 1

## 2018-01-29 MED ORDER — ACETAMINOPHEN 325 MG PO TABS: 650.0000 mg | ORAL_TABLET | Freq: Four times a day (QID) | ORAL | Status: DC | PRN

## 2018-01-29 MED ORDER — LEVOTHYROXINE SODIUM 50 MCG PO TABS
125.0000 ug | ORAL_TABLET | Freq: Every day | ORAL | Status: DC
  Administered 2018-01-29 – 2018-01-30 (×2): 125 ug via ORAL
  Filled 2018-01-29 (×2): qty 3

## 2018-01-29 MED ORDER — ZOLPIDEM TARTRATE 5 MG PO TABS
5.0000 mg | ORAL_TABLET | Freq: Every evening | ORAL | Status: DC | PRN
  Administered 2018-01-29: 5 mg via ORAL
  Filled 2018-01-29: qty 1

## 2018-01-29 MED ORDER — DICYCLOMINE HCL 10 MG PO CAPS
10.0000 mg | ORAL_CAPSULE | Freq: Three times a day (TID) | ORAL | Status: DC
  Administered 2018-01-29 – 2018-01-30 (×6): 10 mg via ORAL
  Filled 2018-01-29 (×6): qty 1

## 2018-01-29 MED ORDER — CYCLOBENZAPRINE HCL 10 MG PO TABS: 10.0000 mg | ORAL_TABLET | Freq: Three times a day (TID) | ORAL | Status: DC | PRN

## 2018-01-29 MED ORDER — LACTATED RINGERS IV SOLN
INTRAVENOUS | Status: DC
  Administered 2018-01-29 – 2018-01-30 (×2): via INTRAVENOUS

## 2018-01-29 MED ORDER — ONDANSETRON HCL 4 MG PO TABS
4.0000 mg | ORAL_TABLET | Freq: Four times a day (QID) | ORAL | Status: DC | PRN
Start: 2018-01-29 — End: 2018-01-30

## 2018-01-29 MED ORDER — ONDANSETRON HCL 4 MG/2ML IJ SOLN: 4.0000 mg | Freq: Four times a day (QID) | INTRAMUSCULAR | Status: DC | PRN

## 2018-01-29 NOTE — ED Notes (Signed)
Will call report at 7:15 for pt to go upstairs

## 2018-01-29 NOTE — ED Provider Notes (Signed)
[facility] Provider Note   CSN: [id number] Arrival date & time: 01/29/18  0344     History   Chief Complaint Chief Complaint  Patient presents with  . Emesis    HPI Kim Schultz is a 77 y.o. female.  The history is provided by the patient.  She has history of hypertension and GERD, and comes into the ED with vomiting and diarrhea which started about 11 PM.  She has vomited numerous times and had numerous episodes of watery diarrhea.  She has noticed some blood when she wipes.  She had chills and sweats but did not notice any fever.  She has had no known sick contacts and has not eaten anything out of the ordinary.  There has been some abdominal cramping.  She thought her problem was intestinal spasms and try to take a dose of dicyclomine, but vomited after taking it.  Past Medical History:  Diagnosis Date  . Arthritis   . GERD (gastroesophageal reflux disease)   . Hypertension   . Thyroid disease     Patient Active Problem List   Diagnosis Date Noted  . Left knee pain 09/13/2015    Past Surgical History:  Procedure Laterality Date  . ABDOMINAL HYSTERECTOMY    . ELBOW SURGERY Left   . TONSILLECTOMY       OB History   None      Home Medications    Prior to Admission medications   Medication Sig Start Date End Date Taking? Authorizing Provider  ALPRAZolam Prudy Feeler) 0.5 MG tablet Take 0.5 mg by mouth 3 (three) times daily.   Yes [provider]  cyclobenzaprine (FLEXERIL) 10 MG tablet One tablet twice a day as needed for spasm 11/19/17  Yes Darreld Mclean, MD  dicyclomine (BENTYL) 10 MG capsule Take 1 capsule (10 mg total) by mouth 4 (four) times daily -  before meals and at bedtime. 04/10/17  Yes Darreld Mclean, MD  HYDROcodone-acetaminophen (NORCO) 7.5-325 MG tablet One tablet every four to six hours for pain.  Must last 30 days. 11/20/17  Yes Darreld Mclean, MD  levothyroxine (SYNTHROID, LEVOTHROID) 125 MCG tablet Take 125 mcg by  mouth daily before breakfast.   Yes [provider]  omeprazole (PRILOSEC) 20 MG capsule  07/22/15  Yes [provider]  zolpidem (AMBIEN) 10 MG tablet Take 10 mg by mouth at bedtime as needed for sleep.   Yes [provider]    Family History No family history on file.  Social History Social History   Tobacco Use  . Smoking status: Never Smoker  . Smokeless tobacco: Never Used  Substance Use Topics  . Alcohol use: No  . Drug use: No     Allergies   Naprosyn [naproxen]   Review of Systems Review of Systems  All other systems reviewed and are negative.    Physical Exam Updated Vital Signs BP (!) 154/90 (BP Location: Left Arm)   Pulse (!) 108   Temp (!) 96.8 F (36 C) (Axillary)   Resp 18   Ht 5\' 7"  (1.702 m)   Wt 81.6 kg (180 lb)   SpO2 96%   BMI 28.19 kg/m   Physical Exam  Nursing note and vitals reviewed.  77 year old female, resting comfortably and in no acute distress. Vital signs are significant for elevated blood pressure and elevated heart rate. Oxygen saturation is 96%, which is normal. Head is normocephalic and atraumatic. PERRLA, EOMI. Oropharynx is clear. Neck is nontender and  supple without adenopathy or JVD. Back is nontender and there is no CVA tenderness. Lungs are clear without rales, wheezes, or rhonchi. Chest is nontender. Heart has regular rate and rhythm without murmur. Abdomen is soft, flat, nontender without masses or hepatosplenomegaly and peristalsis is hypoactive. Extremities have no cyanosis or edema, full range of motion is present. Skin is warm and dry without rash. Neurologic: Mental status is normal, cranial nerves are intact, there are no motor or sensory deficits.  ED Treatments / Results  Labs (all labs ordered are listed, but only abnormal results are displayed) Labs Reviewed  COMPREHENSIVE METABOLIC PANEL - Abnormal; Notable for the following components:      Result Value   Glucose, Bld 162 (*)     BUN 24 (*)    Creatinine, Ser 1.70 (*)    Total Protein 8.3 (*)    GFR calc non Af Amer 28 (*)    GFR calc Af Amer 33 (*)    All other components within normal limits  CBC - Abnormal; Notable for the following components:   WBC 20.9 (*)    Hemoglobin 15.2 (*)    HCT 46.9 (*)    All other components within normal limits  URINALYSIS, ROUTINE W REFLEX MICROSCOPIC - Abnormal; Notable for the following components:   Color, Urine AMBER (*)    APPearance HAZY (*)    Hgb urine dipstick SMALL (*)    Protein, ur 30 (*)    Leukocytes, UA MODERATE (*)    All other components within normal limits  DIFFERENTIAL - Abnormal; Notable for the following components:   Neutro Abs 19.0 (*)    Monocytes Absolute 1.3 (*)    All other components within normal limits  LIPASE, BLOOD   Procedures Procedures   Medications Ordered in ED Medications  sodium chloride 0.9 % bolus 1,000 mL (1,000 mLs Intravenous New Bag/Given 01/29/18 0453)  ondansetron (ZOFRAN) injection 4 mg (4 mg Intravenous Given 01/29/18 0453)  loperamide (IMODIUM) capsule 4 mg (4 mg Oral Given 01/29/18 0453)     Initial Impression / Assessment and Plan / ED Course  I have reviewed the triage vital signs and the nursing notes.  Pertinent labs & imaging results that were available during my care of the patient were reviewed by me and considered in my medical decision making (see chart for details).  Vomiting and diarrhea and pattern strongly suggestive of viral gastroenteritis.  No red flags to suggest more serious pathology.  We will give IV fluids, IV ondansetron, oral loperamide.  Old records are reviewed, and she has no relevant past visits.  Labs showed evidence of acute kidney injury with creatinine 1.7, and most recent creatinine in 2017 1.1.  Creatinine had been as high as 1.8 in 2012, but patient does not have any knowledge of any kidney issues and does not know what her creatinine had been in her doctor's office.  I am going  under the working assumption that this is an acute kidney injury.  Significant leukocytosis is present and felt to be stress related.  Abdominal exam is repeated and continues to be benign.  At this point, I do not feel abdominal imaging is necessary.  However, because of acute kidney injury, I feel she should be admitted for ongoing hydration and monitor both WBC and renal function.  Case is discussed with Dr. Sherryll BurgerShah, of [facility], who agrees to admit the patient.  Final Clinical Impressions(s) / ED Diagnoses   Final diagnoses:  Nausea vomiting  and diarrhea  Acute kidney injury (nontraumatic) (HCC)  Leukocytosis, unspecified type    ED Discharge Orders    None       Dione Booze, MD 01/29/18 [phone]

## 2018-01-29 NOTE — ED Triage Notes (Signed)
Pt states she has spasms of stomach for which she takes Bentyl but then she started vomiting.States she vomited approximately 15 times and had some diarrhea with it.

## 2018-01-29 NOTE — H&P (Signed)
History and Physical    Kim Schultz AVW:[id number] DOB: 1941/06/15 DOA: 01/29/2018  PCP: Aliene Beams, MD   Patient coming from: Home  Chief Complaint: N/V/D  HPI: Kim Schultz is a 77 y.o. female with medical history significant for arthritis, GERD, hypertension, and hypothyroidism who presented to the emergency department early this morning after having sudden onset nausea, vomiting, and watery diarrhea that began at approximately 11 PM last night.  She states that she had to sit on the toilet bowl for very long period of time and had crampy abdominal pain which she does get on occasion and does use dicyclomine to help.  She tried using the medication tonight, but could not even keep the medication down due to her persistent nausea and vomiting.  She noticed some minimal blood when she wipes and noticed that she had some sweats and chills but no fever.  She states that her last meal was around suppertime when she had a banana sandwich. She denies any abdominal pain currently, no hematemesis, or dark, tarry stools.   ED Course: Vital signs stable with some borderline tachycardia and soft blood pressure readings.  She has received a 1 L normal saline fluid bolus, IV Zosyn, and oral loperamide with symptom improvement.  Laboratory data with leukocytosis of 20,900, BUN 24, and creatinine 1.7 with baseline of 1.1.  Glucose of 162.  Review of Systems: All others reviewed and otherwise negative.  Past Medical History:  Diagnosis Date  . Arthritis   . GERD (gastroesophageal reflux disease)   . Hypertension   . Thyroid disease     Past Surgical History:  Procedure Laterality Date  . ABDOMINAL HYSTERECTOMY    . ELBOW SURGERY Left   . TONSILLECTOMY       reports that she has never smoked. She has never used smokeless tobacco. She reports that she does not drink alcohol or use drugs.  Allergies  Allergen Reactions  . Naprosyn [Naproxen]     No family history on file.  Prior to  Admission medications   Medication Sig Start Date End Date Taking? Authorizing Provider  ALPRAZolam Prudy Feeler) 0.5 MG tablet Take 0.5 mg by mouth 3 (three) times daily.   Yes [provider]  cyclobenzaprine (FLEXERIL) 10 MG tablet One tablet twice a day as needed for spasm 11/19/17  Yes Darreld Mclean, MD  dicyclomine (BENTYL) 10 MG capsule Take 1 capsule (10 mg total) by mouth 4 (four) times daily -  before meals and at bedtime. 04/10/17  Yes Darreld Mclean, MD  HYDROcodone-acetaminophen (NORCO) 7.5-325 MG tablet One tablet every four to six hours for pain.  Must last 30 days. 11/20/17  Yes Darreld Mclean, MD  levothyroxine (SYNTHROID, LEVOTHROID) 125 MCG tablet Take 125 mcg by mouth daily before breakfast.   Yes [provider]  omeprazole (PRILOSEC) 20 MG capsule  07/22/15  Yes [provider]  zolpidem (AMBIEN) 10 MG tablet Take 10 mg by mouth at bedtime as needed for sleep.   Yes [provider]    Physical Exam: Vitals:   01/29/18 0400 01/29/18 0403 01/29/18 0500 01/29/18 0515  BP:  (!) 154/90 (!) 94/57   Pulse:  (!) 108 (!) 103 98  Resp:  18 14 18   Temp:  (!) 96.8 F (36 C)    TempSrc:  Axillary    SpO2:  96% 97% 99%  Weight: 81.6 kg (180 lb)     Height: 5\' 7"  (1.702 m)       Constitutional:  NAD, calm, comfortable Vitals:   01/29/18 0400 01/29/18 0403 01/29/18 0500 01/29/18 0515  BP:  (!) 154/90 (!) 94/57   Pulse:  (!) 108 (!) 103 98  Resp:  18 14 18   Temp:  (!) 96.8 F (36 C)    TempSrc:  Axillary    SpO2:  96% 97% 99%  Weight: 81.6 kg (180 lb)     Height: 5\' 7"  (1.702 m)      Eyes: lids and conjunctivae normal ENMT: Mucous membranes are moist.  Neck: normal, supple Respiratory: clear to auscultation bilaterally. Normal respiratory effort. No accessory muscle use.  Cardiovascular: Regular rate and rhythm, no murmurs. No extremity edema. Abdomen: no tenderness, no distention. Bowel sounds positive.  No guarding, rigidity, or  rebound. Musculoskeletal:  No joint deformity upper and lower extremities.   Skin: no rashes, lesions, ulcers.  Psychiatric: Normal judgment and insight. Alert and oriented x 3. Normal mood.   Labs on Admission: I have personally reviewed following labs and imaging studies  CBC: Recent Labs  Lab 01/29/18 0410  WBC 20.9*  NEUTROABS 19.0*  HGB 15.2*  HCT 46.9*  MCV 96.9  PLT 230   Basic Metabolic Panel: Recent Labs  Lab 01/29/18 0410  NA 141  K 4.0  CL 105  CO2 22  GLUCOSE 162*  BUN 24*  CREATININE 1.70*  CALCIUM 9.9   GFR: Estimated Creatinine Clearance: 31.4 mL/min (A) (by C-G formula based on SCr of 1.7 mg/dL (H)). Liver Function Tests: Recent Labs  Lab 01/29/18 0410  AST 26  ALT 11  ALKPHOS 94  BILITOT 0.8  PROT 8.3*  ALBUMIN 4.5   Recent Labs  Lab 01/29/18 0410  LIPASE 26   No results for input(s): AMMONIA in the last 168 hours. Coagulation Profile: No results for input(s): INR, PROTIME in the last 168 hours. Cardiac Enzymes: No results for input(s): CKTOTAL, CKMB, CKMBINDEX, TROPONINI in the last 168 hours. BNP (last 3 results) No results for input(s): PROBNP in the last 8760 hours. HbA1C: No results for input(s): HGBA1C in the last 72 hours. CBG: No results for input(s): GLUCAP in the last 168 hours. Lipid Profile: No results for input(s): CHOL, HDL, LDLCALC, TRIG, CHOLHDL, LDLDIRECT in the last 72 hours. Thyroid Function Tests: No results for input(s): TSH, T4TOTAL, FREET4, T3FREE, THYROIDAB in the last 72 hours. Anemia Panel: No results for input(s): VITAMINB12, FOLATE, FERRITIN, TIBC, IRON, RETICCTPCT in the last 72 hours. Urine analysis:    Component Value Date/Time   COLORURINE AMBER (A) 01/29/2018 0500   APPEARANCEUR HAZY (A) 01/29/2018 0500   LABSPEC 1.012 01/29/2018 0500   PHURINE 5.0 01/29/2018 0500   GLUCOSEU NEGATIVE 01/29/2018 0500   HGBUR SMALL (A) 01/29/2018 0500   BILIRUBINUR NEGATIVE 01/29/2018 0500   KETONESUR NEGATIVE  01/29/2018 0500   PROTEINUR 30 (A) 01/29/2018 0500   UROBILINOGEN 0.2 01/03/2009 1550   NITRITE NEGATIVE 01/29/2018 0500   LEUKOCYTESUR MODERATE (A) 01/29/2018 0500    Radiological Exams on Admission: No results found.  EKG: Independently reviewed. NSR 110bpm. Motion artifact.  Assessment/Plan Principal Problem:   Intractable nausea and vomiting Active Problems:   Essential hypertension   Hypothyroidism   GERD (gastroesophageal reflux disease)   AKI (acute kidney injury) (HCC)   CKD (chronic kidney disease) stage 3, GFR 30-59 ml/min (HCC)    1. Viral gastroenteritis.  Continue symptom management with Zofran and loperamide as needed.  IV fluid hydration.  Clear liquid diet and advance as tolerated.  Reactive leukocytosis is noted which  can be monitored on repeat CBC.  Will hold off on empiric antibiotics and imaging studies at this time.  Will obtain procalcitonin.  Stool studies.  Famotidine IV twice daily.  Continue Bentyl. 2. AKI on CKD stage III.  Likely prerenal and secondary to above.  Continue IV fluid hydration and monitor and repeat renal panel.  Avoid nephrotoxic agents. 3. Hypothyroidism.  Continue on levothyroxine. 4. Hypertension.  Currently with soft blood pressure readings.  Monitor closely.   DVT prophylaxis: SCDs Code Status: Full Family Communication: Daughter at bedside Disposition Plan:IV hydration, symptom management, diet advancement Consults called:None Admission status: Obs, MedSurg   Pratik Hoover BrunetteD Shah DO [facility] Pager [phone]  If 7PM-7AM, please contact night-coverage [email or web address] Password [email or web address]  01/29/2018, 5:55 AM

## 2018-01-29 NOTE — Care Management Obs Status (Signed)
MEDICARE OBSERVATION STATUS NOTIFICATION   Patient Details  Name: Kim PatersonMargaret F Spong MRN: [id number] Date of Birth: 10/04/1940   Medicare Observation Status Notification Given:  Yes    Malcolm MetroChildress, Zimir Kittleson Demske, RN 01/29/2018, 11:22 AM

## 2018-01-29 NOTE — Progress Notes (Signed)
Patient seen and examined, database reviewed.  Daughter at bedside updated on plan of care and all questions answered.  Patient admitted earlier today due to intractable nausea and vomiting as well as acute renal failure.  Patient has significantly improved, however her creatinine remains elevated.  We will keep in the hospital another night on IV fluid therapy.  Continue to follow.  Peggye PittEstela Hernandez, MD [facility] Pager: [phone]

## 2018-01-30 DIAGNOSIS — N179 Acute kidney failure, unspecified: Secondary | ICD-10-CM

## 2018-01-30 DIAGNOSIS — I10 Essential (primary) hypertension: Secondary | ICD-10-CM | POA: Diagnosis not present

## 2018-01-30 DIAGNOSIS — G43A1 Cyclical vomiting, intractable: Secondary | ICD-10-CM

## 2018-01-30 DIAGNOSIS — N183 Chronic kidney disease, stage 3 (moderate): Secondary | ICD-10-CM | POA: Diagnosis not present

## 2018-01-30 DIAGNOSIS — R112 Nausea with vomiting, unspecified: Secondary | ICD-10-CM | POA: Diagnosis not present

## 2018-01-30 LAB — CBC
HCT: 33.6 % — ABNORMAL LOW (ref 36.0–46.0)
Hemoglobin: 10.7 g/dL — ABNORMAL LOW (ref 12.0–15.0)
MCH: 30.8 pg (ref 26.0–34.0)
MCHC: 31.8 g/dL (ref 30.0–36.0)
MCV: 96.8 fL (ref 78.0–100.0)
PLATELETS: 137 10*3/uL — AB (ref 150–400)
RBC: 3.47 MIL/uL — ABNORMAL LOW (ref 3.87–5.11)
RDW: 13.2 % (ref 11.5–15.5)
WBC: 8 10*3/uL (ref 4.0–10.5)

## 2018-01-30 LAB — COMPREHENSIVE METABOLIC PANEL
ALT: 8 U/L (ref 0–44)
ANION GAP: 4 — AB (ref 5–15)
AST: 15 U/L (ref 15–41)
Albumin: 2.9 g/dL — ABNORMAL LOW (ref 3.5–5.0)
Alkaline Phosphatase: 51 U/L (ref 38–126)
BUN: 24 mg/dL — AB (ref 8–23)
CO2: 26 mmol/L (ref 22–32)
CREATININE: 1.51 mg/dL — AB (ref 0.44–1.00)
Calcium: 8.5 mg/dL — ABNORMAL LOW (ref 8.9–10.3)
Chloride: 110 mmol/L (ref 98–111)
GFR, EST AFRICAN AMERICAN: 38 mL/min — AB (ref >60)
GFR, EST NON AFRICAN AMERICAN: 33 mL/min — AB (ref >60)
Glucose, Bld: 105 mg/dL — ABNORMAL HIGH (ref 70–99)
Potassium: 3.7 mmol/L (ref 3.5–5.1)
Sodium: 140 mmol/L (ref 135–145)
Total Bilirubin: 0.8 mg/dL (ref 0.3–1.2)
Total Protein: 5.7 g/dL — ABNORMAL LOW (ref 6.5–8.1)

## 2018-01-30 NOTE — Progress Notes (Signed)
Patient is to be discharged home and in stable condition. Patient's IV removed, WNL. Patient given discharge instructions and verbalized understanding. Patient to be escorted out by staff via wheelchair when ready.  Quita SkyeMorgan P Dishmon, RN

## 2018-01-30 NOTE — Discharge Summary (Signed)
Physician Discharge Summary  Kim PatersonMargaret F Schultz [id number] DOB: 09/09/1940 DOA: 01/29/2018  PCP: Aliene BeamsHagler, Rachel, MD  Admit date: 01/29/2018 Discharge date: 01/30/2018  Time spent: 45 minutes  Recommendations for Outpatient Follow-up:  -Will be discharged home today. -Advised to follow up with PCP in 1 week at which time BMET should be drawn to follow renal function. -Advised copious fluid intake.   Discharge Diagnoses:  Principal Problem:   Intractable nausea and vomiting Active Problems:   Essential hypertension   Hypothyroidism   GERD (gastroesophageal reflux disease)   AKI (acute kidney injury) (HCC)   CKD (chronic kidney disease) stage 3, GFR 30-59 ml/min (HCC)   Discharge Condition: Stable and improved  Filed Weights   01/29/18 0400 01/29/18 0824  Weight: 81.6 kg (180 lb) 80.3 kg (177 lb 0.5 oz)    History of present illness:  As per Dr. Sherryll BurgerShah on 7/24: Kim Schultz is a 77 y.o. female with medical history significant for arthritis, GERD, hypertension, and hypothyroidism who presented to the emergency department early this morning after having sudden onset nausea, vomiting, and watery diarrhea that began at approximately 11 PM last night.  She states that she had to sit on the toilet bowl for very long period of time and had crampy abdominal pain which she does get on occasion and does use dicyclomine to help.  She tried using the medication tonight, but could not even keep the medication down due to her persistent nausea and vomiting.  She noticed some minimal blood when she wipes and noticed that she had some sweats and chills but no fever.  She states that her last meal was around suppertime when she had a banana sandwich. She denies any abdominal pain currently, no hematemesis, or dark, tarry stools.   ED Course: Vital signs stable with some borderline tachycardia and soft blood pressure readings.  She has received a 1 L normal saline fluid bolus, IV Zosyn, and oral  loperamide with symptom improvement.  Laboratory data with leukocytosis of 20,900, BUN 24, and creatinine 1.7 with baseline of 1.1.  Glucose of 162.    Hospital Course:   Intractable nausea, vomiting, diarrhea -Completely resolved. -Suspect acute viral gastroenteritis with self-limiting course.  Acute on chronic kidney disease stage II -Patient's baseline creatinine is 1.1, creatinine was 1.7 on admission and down to 1.5 on discharge. -Patient advised to continue copious fluid intake and will need basic metabolic profile to be drawn at time of her next outpatient follow-up which is in 1 week.  Procedures:  None   Consultations:  None  Discharge Instructions  Discharge Instructions    Diet - low sodium heart healthy   Complete by:  As directed    Increase activity slowly   Complete by:  As directed      Allergies as of 01/30/2018      Reactions   Naprosyn [naproxen]       Medication List    STOP taking these medications   lisinopril 10 MG tablet Commonly known as:  PRINIVIL,ZESTRIL     TAKE these medications   ALPRAZolam 0.5 MG tablet Commonly known as:  XANAX Take 0.5 mg by mouth 3 (three) times daily.   cyclobenzaprine 10 MG tablet Commonly known as:  FLEXERIL One tablet twice a day as needed for spasm   dicyclomine 10 MG capsule Commonly known as:  BENTYL Take 1 capsule (10 mg total) by mouth 4 (four) times daily -  before meals and at bedtime.  HYDROcodone-acetaminophen 7.5-325 MG tablet Commonly known as:  NORCO One tablet every four to six hours for pain.  Must last 30 days.   levothyroxine 88 MCG tablet Commonly known as:  SYNTHROID, LEVOTHROID Take 1 tablet by mouth daily.   omeprazole 20 MG capsule Commonly known as:  PRILOSEC   zolpidem 10 MG tablet Commonly known as:  AMBIEN Take 10 mg by mouth at bedtime as needed for sleep.      Allergies  Allergen Reactions  . Naprosyn [Naproxen]    Follow-up Information    Aliene Beams, MD.  Schedule an appointment as soon as possible for a visit in 2 week(s).   Specialty:  Family Medicine Contact information: [address] [place] [address] [place] [address] [phone]            The results of significant diagnostics from this hospitalization (including imaging, microbiology, ancillary and laboratory) are listed below for reference.    Significant Diagnostic Studies: No results found.  Microbiology: No results found for this or any previous visit (from the past 240 hour(s)).   Labs: Basic Metabolic Panel: Recent Labs  Lab 01/29/18 0410 01/30/18 0541  NA 141 140  K 4.0 3.7  CL 105 110  CO2 22 26  GLUCOSE 162* 105*  BUN 24* 24*  CREATININE 1.70* 1.51*  CALCIUM 9.9 8.5*   Liver Function Tests: Recent Labs  Lab 01/29/18 0410 01/30/18 0541  AST 26 15  ALT 11 8  ALKPHOS 94 51  BILITOT 0.8 0.8  PROT 8.3* 5.7*  ALBUMIN 4.5 2.9*   Recent Labs  Lab 01/29/18 0410  LIPASE 26   No results for input(s): AMMONIA in the last 168 hours. CBC: Recent Labs  Lab 01/29/18 0410 01/30/18 0541  WBC 20.9* 8.0  NEUTROABS 19.0*  --   HGB 15.2* 10.7*  HCT 46.9* 33.6*  MCV 96.9 96.8  PLT 230 137*   Cardiac Enzymes: No results for input(s): CKTOTAL, CKMB, CKMBINDEX, TROPONINI in the last 168 hours. BNP: BNP (last 3 results) No results for input(s): BNP in the last 8760 hours.  ProBNP (last 3 results) No results for input(s): PROBNP in the last 8760 hours.  CBG: No results for input(s): GLUCAP in the last 168 hours.     Signed:  Chaya Jan  [facility] Pager: [phone] 01/30/2018, 11:39 AM

## 2018-02-03 ENCOUNTER — Telehealth: Payer: Self-pay

## 2018-02-03 NOTE — Telephone Encounter (Signed)
[id number]  [facility] contact [facility] No answer. Left message requesting call back. Will try again later. 1st attempt

## 2018-02-19 ENCOUNTER — Ambulatory Visit: Payer: Medicare Other | Admitting: Orthopaedic Surgery

## 2018-02-26 ENCOUNTER — Encounter: Payer: Self-pay | Admitting: Orthopaedic Surgery

## 2018-02-26 ENCOUNTER — Ambulatory Visit (INDEPENDENT_AMBULATORY_CARE_PROVIDER_SITE_OTHER): Payer: Medicare Other | Admitting: Orthopaedic Surgery

## 2018-02-26 VITALS — BP 167/89 | HR 82 | Ht 67.0 in | Wt 178.0 lb

## 2018-02-26 DIAGNOSIS — G8929 Other chronic pain: Secondary | ICD-10-CM

## 2018-02-26 DIAGNOSIS — M25562 Pain in left knee: Secondary | ICD-10-CM | POA: Diagnosis not present

## 2018-02-26 DIAGNOSIS — M25522 Pain in left elbow: Secondary | ICD-10-CM | POA: Diagnosis not present

## 2018-02-26 MED ORDER — CYCLOBENZAPRINE HCL 10 MG PO TABS: ORAL_TABLET | ORAL | 2 refills | Status: DC

## 2018-02-26 MED ORDER — HYDROCODONE-ACETAMINOPHEN 7.5-325 MG PO TABS: ORAL_TABLET | ORAL | 0 refills | Status: DC

## 2018-02-26 NOTE — Progress Notes (Signed)
Patient YQ:MVHQIONG:Kim Schultz, female DOB:01/10/1941, 77 y.o. EXB:[id number]  Chief Complaint  Patient presents with  . Follow-up    left elbow and left knee pain    HPI  Kim Schultz is a 77 y.o. female who has chronic left elbow pain and pain of the left knee.  She has swelling and popping of the left knee but no giving way. She has pain of the left elbow more with rainy weather.  She has no new trauma. She was in the hospital in July from GI problems.   Body mass index is 27.88 kg/m.  ROS  Review of Systems  Constitutional:       Patient does not have Diabetes Mellitus. Patient does not have hypertension. Patient does not have COPD or shortness of breath. Patient does not have BMI > 35. Patient does not have current smoking history.  HENT: Negative for congestion.   Respiratory: Negative for cough and shortness of breath.   Cardiovascular: Negative for chest pain.  Endocrine: Positive for cold intolerance.  Musculoskeletal: Positive for arthralgias, back pain, gait problem, joint swelling and myalgias.  Allergic/Immunologic: Positive for environmental allergies.  Psychiatric/Behavioral: The patient is nervous/anxious.   All other systems reviewed and are negative.   All other systems reviewed and are negative.  The following is a summary of the past history medically, past history surgically, known current medicines, social history and family history.  This information is gathered electronically by the computer from prior information and documentation.  I review this each visit and have found including this information at this point in the chart is beneficial and informative.    Past Medical History:  Diagnosis Date  . Arthritis   . GERD (gastroesophageal reflux disease)   . Hypertension   . Thyroid disease     Past Surgical History:  Procedure Laterality Date  . ABDOMINAL HYSTERECTOMY    . ELBOW SURGERY Left   . TONSILLECTOMY      History reviewed. No  pertinent family history.  Social History Social History   Tobacco Use  . Smoking status: Never Smoker  . Smokeless tobacco: Never Used  Substance Use Topics  . Alcohol use: No  . Drug use: No    Allergies  Allergen Reactions  . Naprosyn [Naproxen]     Current Outpatient Medications  Medication Sig Dispense Refill  . ALPRAZolam (XANAX) 0.5 MG tablet Take 0.5 mg by mouth 3 (three) times daily.    . cyclobenzaprine (FLEXERIL) 10 MG tablet One tablet twice a day as needed for spasm 60 tablet 2  . dicyclomine (BENTYL) 10 MG capsule Take 1 capsule (10 mg total) by mouth 4 (four) times daily -  before meals and at bedtime. 90 capsule 3  . HYDROcodone-acetaminophen (NORCO) 7.5-325 MG tablet One tablet every four to six hours for pain.  Must last 30 days. 120 tablet 0  . levothyroxine (SYNTHROID, LEVOTHROID) 88 MCG tablet Take 1 tablet by mouth daily.  0  . omeprazole (PRILOSEC) 20 MG capsule     . zolpidem (AMBIEN) 10 MG tablet Take 10 mg by mouth at bedtime as needed for sleep.     No current facility-administered medications for this visit.      Physical Exam  Blood pressure (!) 167/89, pulse 82, height 5\' 7"  (1.702 m), weight 178 lb (80.7 kg).  Constitutional: overall normal hygiene, normal nutrition, well developed, normal grooming, normal body habitus. Assistive device:none  Musculoskeletal: gait and station Limp left, muscle tone and strength are  normal, no tremors or atrophy is present.  .  Neurological: coordination overall normal.  Deep tendon reflex/nerve stretch intact.  Sensation normal.  Cranial nerves II-XII intact.   Skin:   Normal overall no scars, lesions, ulcers or rashes. No psoriasis.  Psychiatric: Alert and oriented x 3.  Recent memory intact, remote memory unclear.  Normal mood and affect. Well groomed.  Good eye contact.  Cardiovascular: overall no swelling, no varicosities, no edema bilaterally, normal temperatures of the legs and arms, no clubbing,  cyanosis and good capillary refill.  Lymphatic: palpation is normal.  All other systems reviewed and are negative   Left elbow has tenderness and ROM of 5 to 100.  NV is intact.  Left knee has ROM of 0 to 100 with crepitus, effusion and medial pain.  The knee is stable.  NV is intact.  The patient has been educated about the nature of the problem(s) and counseled on treatment options.  The patient appeared to understand what I have discussed and is in agreement with it.  Encounter Diagnoses  Name Primary?  . Chronic pain of left knee Yes  . Elbow pain, chronic, left     PLAN Call if any problems.  Precautions discussed.  Continue current medications.   Return to clinic 3 months   I have [facility] the [facility] [place] [facility] [place] [facility] web site prior to prescribing narcotic medicine for this patient.  Electronically Signed Darreld McleanWayne Melody Cirrincione, MD 8/21/20199:55 AM

## 2018-04-10 ENCOUNTER — Encounter: Payer: Self-pay | Admitting: Orthopaedic Surgery

## 2018-04-22 ENCOUNTER — Encounter: Payer: Self-pay | Admitting: Orthopaedic Surgery

## 2018-04-22 ENCOUNTER — Ambulatory Visit (INDEPENDENT_AMBULATORY_CARE_PROVIDER_SITE_OTHER): Payer: Medicare Other | Admitting: Orthopaedic Surgery

## 2018-04-22 ENCOUNTER — Ambulatory Visit (INDEPENDENT_AMBULATORY_CARE_PROVIDER_SITE_OTHER): Payer: Medicare Other

## 2018-04-22 VITALS — BP 140/80 | HR 79 | Ht 67.0 in | Wt 183.0 lb

## 2018-04-22 DIAGNOSIS — M25522 Pain in left elbow: Secondary | ICD-10-CM | POA: Diagnosis not present

## 2018-04-22 DIAGNOSIS — M25552 Pain in left hip: Secondary | ICD-10-CM

## 2018-04-22 MED ORDER — CYCLOBENZAPRINE HCL 10 MG PO TABS: ORAL_TABLET | ORAL | 2 refills | Status: DC

## 2018-04-22 MED ORDER — HYDROCODONE-ACETAMINOPHEN 7.5-325 MG PO TABS: ORAL_TABLET | ORAL | 0 refills | Status: DC

## 2018-04-22 NOTE — Progress Notes (Signed)
Patient [name], female DOB:[birth year], 77 y.o. UJW:[id number]  Chief Complaint  Patient presents with  . Elbow Injury    Left elbow, DOI 04-17-18.    HPI  Kim Schultz is a 77 y.o. female who fell at home over an extension cord just outside her door.  She hurt her left hip and left elbow.  She has had prior surgery on the left elbow multiple times and has limited ROM of the left elbow.  She fell yesterday. She has bruising of the left elbow and pain with motion.  Her left hip is tender.  She has no other injury.   Body mass index is 28.66 kg/m.  ROS  Review of Systems  Constitutional:       Patient does not have Diabetes Mellitus. Patient does not have hypertension. Patient does not have COPD or shortness of breath. Patient does not have BMI > 35. Patient does not have current smoking history.  HENT: Negative for congestion.   Respiratory: Negative for cough and shortness of breath.   Cardiovascular: Negative for chest pain.  Endocrine: Positive for cold intolerance.  Musculoskeletal: Positive for arthralgias, back pain, gait problem, joint swelling and myalgias.  Allergic/Immunologic: Positive for environmental allergies.  Psychiatric/Behavioral: The patient is nervous/anxious.   All other systems reviewed and are negative.   All other systems reviewed and are negative.  The following is a summary of the past history medically, past history surgically, known current medicines, social history and family history.  This information is gathered electronically by the computer from prior information and documentation.  I review this each visit and have found including this information at this point in the chart is beneficial and informative.    Past Medical History:  Diagnosis Date  . Arthritis   . GERD (gastroesophageal reflux disease)   . Hypertension   . Thyroid disease     Past Surgical History:  Procedure Laterality Date  . ABDOMINAL HYSTERECTOMY    .  ELBOW SURGERY Left   . TONSILLECTOMY      History reviewed. No pertinent family history.  Social History Social History   Tobacco Use  . Smoking status: Never Smoker  . Smokeless tobacco: Never Used  Substance Use Topics  . Alcohol use: No  . Drug use: No    Allergies  Allergen Reactions  . Naprosyn [Naproxen]     Current Outpatient Medications  Medication Sig Dispense Refill  . ALPRAZolam (XANAX) 0.5 MG tablet Take 0.5 mg by mouth 3 (three) times daily.    . cyclobenzaprine (FLEXERIL) 10 MG tablet One tablet twice a day as needed for spasm 60 tablet 2  . dicyclomine (BENTYL) 10 MG capsule Take 1 capsule (10 mg total) by mouth 4 (four) times daily -  before meals and at bedtime. 90 capsule 3  . HYDROcodone-acetaminophen (NORCO) 7.5-325 MG tablet One tablet every four to six hours for pain.  Must last 30 days. 120 tablet 0  . levothyroxine (SYNTHROID, LEVOTHROID) 88 MCG tablet Take 1 tablet by mouth daily.  0  . omeprazole (PRILOSEC) 20 MG capsule     . zolpidem (AMBIEN) 10 MG tablet Take 10 mg by mouth at bedtime as needed for sleep.     No current facility-administered medications for this visit.      Physical Exam  Blood pressure 140/80, pulse 79, height 5\' 7"  (1.702 m), weight 183 lb (83 kg).  Constitutional: overall normal hygiene, normal nutrition, well developed, normal grooming, normal body habitus. Assistive  device:none  Musculoskeletal: gait and station Limp left, muscle tone and strength are normal, no tremors or atrophy is present.  .  Neurological: coordination overall normal.  Deep tendon reflex/nerve stretch intact.  Sensation normal.  Cranial nerves II-XII intact.   Skin:   Normal overall no scars, lesions, ulcers or rashes. No psoriasis.  Psychiatric: Alert and oriented x 3.  Recent memory intact, remote memory unclear.  Normal mood and affect. Well groomed.  Good eye contact.  Cardiovascular: overall no swelling, no varicosities, no edema  bilaterally, normal temperatures of the legs and arms, no clubbing, cyanosis and good capillary refill.  Lymphatic: palpation is normal.  Left elbow with prior well healed scars.  She has ecchymosis of the left elbow. ROM is decreased secondary to pain and also prior surgery.  NV intact.  The left hip is tender more laterally, ROM is full.  NV intact. All other systems reviewed and are negative   The patient has been educated about the nature of the problem(s) and counseled on treatment options.  The patient appeared to understand what I have discussed and is in agreement with it.  Encounter Diagnoses  Name Primary?  . Elbow pain, left Yes  . Hip pain, left    X-rays were done of the left elbow and left hip, reported separately.  No new fracture seen.  PLAN Call if any problems.  Precautions discussed.  Continue current medications.   Return to clinic 2 weeks  A sling was given.   I have reviewed the [place] Controlled Substance Reporting System web site prior to prescribing narcotic medicine for this patient.    Electronically Signed Darreld Mclean, MD 10/15/20193:28 PM

## 2018-05-07 ENCOUNTER — Encounter: Payer: Self-pay | Admitting: Orthopaedic Surgery

## 2018-05-07 ENCOUNTER — Ambulatory Visit (INDEPENDENT_AMBULATORY_CARE_PROVIDER_SITE_OTHER): Payer: Medicare Other | Admitting: Orthopaedic Surgery

## 2018-05-07 VITALS — BP 111/81 | HR 88 | Ht 67.0 in | Wt 177.0 lb

## 2018-05-07 DIAGNOSIS — M25552 Pain in left hip: Secondary | ICD-10-CM

## 2018-05-07 DIAGNOSIS — M25522 Pain in left elbow: Secondary | ICD-10-CM

## 2018-05-07 MED ORDER — IBUPROFEN 600 MG PO TABS: ORAL_TABLET | ORAL | 5 refills | Status: DC

## 2018-05-07 NOTE — Progress Notes (Signed)
Patient Kim Schultz Kim Schultz, female DOB:04-Nov-1940, 77 y.o. UJW:[id number]  Chief Complaint  Patient presents with  . Elbow Pain    Left elbow and hip pain after fall 04/17/18    HPI  LAKSHMI Schultz is a 77 y.o. female who has left elbow pain chronically that was aggravated with recent fall.  She has left hip pain as well but that is improving.  The cooler weather has made the elbow pain worse.  She has no numbness or redness.   Body mass index is 27.72 kg/m.  ROS  Review of Systems  Constitutional:       Patient does not have Diabetes Mellitus. Patient does not have hypertension. Patient does not have COPD or shortness of breath. Patient does not have BMI > 35. Patient does not have current smoking history.  HENT: Negative for congestion.   Respiratory: Negative for cough and shortness of breath.   Cardiovascular: Negative for chest pain.  Endocrine: Positive for cold intolerance.  Musculoskeletal: Positive for arthralgias, back pain, gait problem, joint swelling and myalgias.  Allergic/Immunologic: Positive for environmental allergies.  Psychiatric/Behavioral: The patient is nervous/anxious.   All other systems reviewed and are negative.   All other systems reviewed and are negative.  The following is a summary of the past history medically, past history surgically, known current medicines, social history and family history.  This information is gathered electronically by the computer from prior information and documentation.  I review this each visit and have found including this information at this point in the chart is beneficial and informative.    Past Medical History:  Diagnosis Date  . Arthritis   . GERD (gastroesophageal reflux disease)   . Hypertension   . Thyroid disease     Past Surgical History:  Procedure Laterality Date  . ABDOMINAL HYSTERECTOMY    . ELBOW SURGERY Left   . TONSILLECTOMY      History reviewed. No pertinent family history.  Social  History Social History   Tobacco Use  . Smoking status: Never Smoker  . Smokeless tobacco: Never Used  Substance Use Topics  . Alcohol use: No  . Drug use: No    Allergies  Allergen Reactions  . Naprosyn [Naproxen]     Current Outpatient Medications  Medication Sig Dispense Refill  . ALPRAZolam (XANAX) 0.5 MG tablet Take 0.5 mg by mouth 3 (three) times daily.    . cyclobenzaprine (FLEXERIL) 10 MG tablet One tablet twice a day as needed for spasm 60 tablet 2  . dicyclomine (BENTYL) 10 MG capsule Take 1 capsule (10 mg total) by mouth 4 (four) times daily -  before meals and at bedtime. 90 capsule 3  . HYDROcodone-acetaminophen (NORCO) 7.5-325 MG tablet One tablet every four to six hours for pain.  Must last 30 days. 120 tablet 0  . ibuprofen (ADVIL,MOTRIN) 600 MG tablet One tablet with food up to three times a day as needed for pain. 100 tablet 5  . levothyroxine (SYNTHROID, LEVOTHROID) 88 MCG tablet Take 1 tablet by mouth daily.  0  . omeprazole (PRILOSEC) 20 MG capsule     . zolpidem (AMBIEN) 10 MG tablet Take 10 mg by mouth at bedtime as needed for sleep.     No current facility-administered medications for this visit.      Physical Exam  Blood pressure 111/81, pulse 88, height 5\' 7"  (1.702 m), weight 177 lb (80.3 kg).  Constitutional: overall normal hygiene, normal nutrition, well developed, normal grooming, normal body habitus.  Assistive device:none  Musculoskeletal: gait and station Limp none, muscle tone and strength are normal, no tremors or atrophy is present.  .  Neurological: coordination overall normal.  Deep tendon reflex/nerve stretch intact.  Sensation normal.  Cranial nerves II-XII intact.   Skin:   Normal overall no scars, lesions, ulcers or rashes. No psoriasis.  Psychiatric: Alert and oriented x 3.  Recent memory intact, remote memory unclear.  Normal mood and affect. Well groomed.  Good eye contact.  Cardiovascular: overall no swelling, no varicosities,  no edema bilaterally, normal temperatures of the legs and arms, no clubbing, cyanosis and good capillary refill.  Lymphatic: palpation is normal.  Left elbow has decreased motion and old healed scars.  NV intact.  Motion is very tender.  Left hip is tender but has full motion.  All other systems reviewed and are negative   The patient has been educated about the nature of the problem(s) and counseled on treatment options.  The patient appeared to understand what I have discussed and is in agreement with it.  Encounter Diagnoses  Name Primary?  . Elbow pain, left Yes  . Hip pain, left     PLAN Call if any problems.  Precautions discussed.  Continue current medications. I will add ibuprofen 600.  Return to clinic November 21    Electronically Signed Darreld Mclean, MD 10/30/20192:05 PM

## 2018-05-29 ENCOUNTER — Encounter: Payer: Self-pay | Admitting: Orthopaedic Surgery

## 2018-05-29 ENCOUNTER — Ambulatory Visit (INDEPENDENT_AMBULATORY_CARE_PROVIDER_SITE_OTHER): Payer: Medicare Other | Admitting: Orthopaedic Surgery

## 2018-05-29 VITALS — BP 175/84 | HR 86 | Ht 67.0 in | Wt 180.0 lb

## 2018-05-29 DIAGNOSIS — M25522 Pain in left elbow: Secondary | ICD-10-CM | POA: Diagnosis not present

## 2018-05-29 DIAGNOSIS — G8929 Other chronic pain: Secondary | ICD-10-CM

## 2018-05-29 NOTE — Progress Notes (Signed)
Patient ZO:XWRUEAVW:Kim Schultz, female DOB:11/28/1940, 77 y.o. UJW:[id number]  Chief Complaint  Patient presents with  . Elbow Pain    left    HPI  Kim Schultz is a 77 y.o. female who has chronic elbow pain on the left after a recent fall.  She has had multiple surgeries on the elbow in the past and has a limited ROM.  She has more pain with the cold weather.  She is taking her pain medicine.  She has no numbness.     Body mass index is 28.19 kg/m.  ROS  Review of Systems  Constitutional:       Patient does not have Diabetes Mellitus. Patient does not have hypertension. Patient does not have COPD or shortness of breath. Patient does not have BMI > 35. Patient does not have current smoking history.  HENT: Negative for congestion.   Respiratory: Negative for cough and shortness of breath.   Cardiovascular: Negative for chest pain.  Endocrine: Positive for cold intolerance.  Musculoskeletal: Positive for arthralgias, back pain, gait problem, joint swelling and myalgias.  Allergic/Immunologic: Positive for environmental allergies.  Psychiatric/Behavioral: The patient is nervous/anxious.   All other systems reviewed and are negative.   All other systems reviewed and are negative.  The following is a summary of the past history medically, past history surgically, known current medicines, social history and family history.  This information is gathered electronically by the computer from prior information and documentation.  I review this each visit and have found including this information at this point in the chart is beneficial and informative.    Past Medical History:  Diagnosis Date  . Arthritis   . GERD (gastroesophageal reflux disease)   . Hypertension   . Thyroid disease     Past Surgical History:  Procedure Laterality Date  . ABDOMINAL HYSTERECTOMY    . ELBOW SURGERY Left   . TONSILLECTOMY      Family History  Family history unknown: Yes    Social  History Social History   Tobacco Use  . Smoking status: Never Smoker  . Smokeless tobacco: Never Used  Substance Use Topics  . Alcohol use: No  . Drug use: No    Allergies  Allergen Reactions  . Naprosyn [Naproxen]     Current Outpatient Medications  Medication Sig Dispense Refill  . ALPRAZolam (XANAX) 0.5 MG tablet Take 0.5 mg by mouth 3 (three) times daily.    . cyclobenzaprine (FLEXERIL) 10 MG tablet One tablet twice a day as needed for spasm 60 tablet 2  . dicyclomine (BENTYL) 10 MG capsule Take 1 capsule (10 mg total) by mouth 4 (four) times daily -  before meals and at bedtime. 90 capsule 3  . HYDROcodone-acetaminophen (NORCO) 7.5-325 MG tablet One tablet every four to six hours for pain.  Must last 30 days. 120 tablet 0  . ibuprofen (ADVIL,MOTRIN) 600 MG tablet One tablet with food up to three times a day as needed for pain. 100 tablet 5  . levothyroxine (SYNTHROID, LEVOTHROID) 75 MCG tablet TAKE 1 TABLET BY MOUTH ONCE DAILY BEFORE BREAKFAST  0  . lisinopril (PRINIVIL,ZESTRIL) 20 MG tablet     . omeprazole (PRILOSEC) 20 MG capsule     . zolpidem (AMBIEN) 10 MG tablet Take 10 mg by mouth at bedtime as needed for sleep.     No current facility-administered medications for this visit.      Physical Exam  Blood pressure (!) 175/84, pulse 86, height 5\' 7"  (  1.702 m), weight 180 lb (81.6 kg).  Constitutional: overall normal hygiene, normal nutrition, well developed, normal grooming, normal body habitus. Assistive device:none  Musculoskeletal: gait and station Limp none, muscle tone and strength are normal, no tremors or atrophy is present.  .  Neurological: coordination overall normal.  Deep tendon reflex/nerve stretch intact.  Sensation normal.  Cranial nerves II-XII intact.   Skin:   Normal overall no scars, lesions, ulcers or rashes. No psoriasis.  Psychiatric: Alert and oriented x 3.  Recent memory intact, remote memory unclear.  Normal mood and affect. Well groomed.   Good eye contact.  Cardiovascular: overall no swelling, no varicosities, no edema bilaterally, normal temperatures of the legs and arms, no clubbing, cyanosis and good capillary refill.  Lymphatic: palpation is normal.  Left elbow with limited ROM of 25 to 95. Supination and pronation limited.  Pain is present.  No effusion or redness present.  Crepitus is present.  All other systems reviewed and are negative   The patient has been educated about the nature of the problem(s) and counseled on treatment options.  The patient appeared to understand what I have discussed and is in agreement with it.  Encounter Diagnosis  Name Primary?  . Elbow pain, chronic, left Yes    PLAN Call if any problems.  Precautions discussed.  Continue current medications.   Return to clinic 3 weeks   Electronically Signed Darreld Mclean, MD 11/21/201910:21 AM

## 2018-06-03 ENCOUNTER — Ambulatory Visit: Payer: Medicare Other | Admitting: Orthopaedic Surgery

## 2018-06-19 ENCOUNTER — Ambulatory Visit: Payer: Medicare Other | Admitting: Orthopaedic Surgery

## 2018-07-23 ENCOUNTER — Ambulatory Visit (INDEPENDENT_AMBULATORY_CARE_PROVIDER_SITE_OTHER): Payer: Medicare Other | Admitting: Orthopaedic Surgery

## 2018-07-23 ENCOUNTER — Encounter: Payer: Self-pay | Admitting: Orthopaedic Surgery

## 2018-07-23 VITALS — BP 159/100 | HR 97 | Ht 67.0 in | Wt 177.0 lb

## 2018-07-23 DIAGNOSIS — M25522 Pain in left elbow: Secondary | ICD-10-CM | POA: Diagnosis not present

## 2018-07-23 DIAGNOSIS — G8929 Other chronic pain: Secondary | ICD-10-CM | POA: Diagnosis not present

## 2018-07-23 MED ORDER — CYCLOBENZAPRINE HCL 10 MG PO TABS: ORAL_TABLET | ORAL | 2 refills | Status: DC

## 2018-07-23 MED ORDER — HYDROCODONE-ACETAMINOPHEN 7.5-325 MG PO TABS: ORAL_TABLET | ORAL | 0 refills | Status: DC

## 2018-07-23 NOTE — Progress Notes (Signed)
Patient Kim Schultz Kim Schultz, female DOB:Nov 07, 1940, 78 y.o. MLY:[id number]  Chief Complaint  Patient presents with  . Knee Pain  . Elbow Pain    HPI  Kim Schultz is a 78 y.o. female who has chronic left elbow pain.  The cold weather makes it worse. She has no new trauma, no numbness  She has some swelling.   Body mass index is 27.72 kg/m.  ROS  Review of Systems  Constitutional:       Patient does not have Diabetes Mellitus. Patient does not have hypertension. Patient does not have COPD or shortness of breath. Patient does not have BMI > 35. Patient does not have current smoking history.  HENT: Negative for congestion.   Respiratory: Negative for cough and shortness of breath.   Cardiovascular: Negative for chest pain.  Endocrine: Positive for cold intolerance.  Musculoskeletal: Positive for arthralgias, back pain, gait problem, joint swelling and myalgias.  Allergic/Immunologic: Positive for environmental allergies.  Psychiatric/Behavioral: The patient is nervous/anxious.   All other systems reviewed and are negative.   All other systems reviewed and are negative.  The following is a summary of the past history medically, past history surgically, known current medicines, social history and family history.  This information is gathered electronically by the computer from prior information and documentation.  I review this each visit and have found including this information at this point in the chart is beneficial and informative.    Past Medical History:  Diagnosis Date  . Arthritis   . GERD (gastroesophageal reflux disease)   . Hypertension   . Thyroid disease     Past Surgical History:  Procedure Laterality Date  . ABDOMINAL HYSTERECTOMY    . ELBOW SURGERY Left   . TONSILLECTOMY      Family History  Family history unknown: Yes    Social History Social History   Tobacco Use  . Smoking status: Never Smoker  . Smokeless tobacco: Never Used  Substance  Use Topics  . Alcohol use: No  . Drug use: No    Allergies  Allergen Reactions  . Naprosyn [Naproxen]     Current Outpatient Medications  Medication Sig Dispense Refill  . ALPRAZolam (XANAX) 0.5 MG tablet Take 0.5 mg by mouth 3 (three) times daily.    . cyclobenzaprine (FLEXERIL) 10 MG tablet One tablet twice a day as needed for spasm 60 tablet 2  . dicyclomine (BENTYL) 10 MG capsule Take 1 capsule (10 mg total) by mouth 4 (four) times daily -  before meals and at bedtime. 90 capsule 3  . HYDROcodone-acetaminophen (NORCO) 7.5-325 MG tablet One tablet every four to six hours for pain.  Must last 30 days. 120 tablet 0  . ibuprofen (ADVIL,MOTRIN) 600 MG tablet One tablet with food up to three times a day as needed for pain. 100 tablet 5  . levothyroxine (SYNTHROID, LEVOTHROID) 75 MCG tablet TAKE 1 TABLET BY MOUTH ONCE DAILY BEFORE BREAKFAST  0  . lisinopril (PRINIVIL,ZESTRIL) 20 MG tablet     . omeprazole (PRILOSEC) 20 MG capsule     . zolpidem (AMBIEN) 10 MG tablet Take 10 mg by mouth at bedtime as needed for sleep.     No current facility-administered medications for this visit.      Physical Exam  Blood pressure (!) 159/100, pulse 97, height 5\' 7"  (1.702 m), weight 177 lb (80.3 kg).  Constitutional: overall normal hygiene, normal nutrition, well developed, normal grooming, normal body habitus. Assistive device:none  Musculoskeletal: gait and  station Limp none, muscle tone and strength are normal, no tremors or atrophy is present.  .  Neurological: coordination overall normal.  Deep tendon reflex/nerve stretch intact.  Sensation normal.  Cranial nerves II-XII intact.   Skin:   Normal overall no scars, lesions, ulcers or rashes. No psoriasis.  Psychiatric: Alert and oriented x 3.  Recent memory intact, remote memory unclear.  Normal mood and affect. Well groomed.  Good eye contact.  Cardiovascular: overall no swelling, no varicosities, no edema bilaterally, normal temperatures  of the legs and arms, no clubbing, cyanosis and good capillary refill.  Lymphatic: palpation is normal.  Left elbow has ROM of 20 to 95 with pain and limited supination and pronation.  NV intact.  All other systems reviewed and are negative   The patient has been educated about the nature of the problem(s) and counseled on treatment options.  The patient appeared to understand what I have discussed and is in agreement with it.  Encounter Diagnosis  Name Primary?  . Elbow pain, chronic, left Yes    PLAN Call if any problems.  Precautions discussed.  Continue current medications.   Return to clinic 2 months   I [facility] the [facility] [place] [facility] web site prior to prescribing narcotic medicine for this patient.   Electronically Signed Darreld McleanWayne Thaddius Manes, MD 1/15/20201:48 PM

## 2018-09-24 ENCOUNTER — Ambulatory Visit (INDEPENDENT_AMBULATORY_CARE_PROVIDER_SITE_OTHER): Payer: Medicare Other | Admitting: Orthopaedic Surgery

## 2018-09-24 ENCOUNTER — Encounter: Payer: Self-pay | Admitting: Orthopaedic Surgery

## 2018-09-24 ENCOUNTER — Other Ambulatory Visit: Payer: Self-pay

## 2018-09-24 VITALS — BP 156/83 | HR 93 | Ht 67.0 in | Wt 174.0 lb

## 2018-09-24 DIAGNOSIS — M25522 Pain in left elbow: Secondary | ICD-10-CM | POA: Diagnosis not present

## 2018-09-24 DIAGNOSIS — G8929 Other chronic pain: Secondary | ICD-10-CM

## 2018-09-24 MED ORDER — HYDROCODONE-ACETAMINOPHEN 7.5-325 MG PO TABS: ORAL_TABLET | ORAL | 0 refills | Status: DC

## 2018-09-24 MED ORDER — DICYCLOMINE HCL 10 MG PO CAPS: 10.0000 mg | ORAL_CAPSULE | Freq: Three times a day (TID) | ORAL | 3 refills | Status: DC

## 2018-09-24 NOTE — Progress Notes (Signed)
Patient JZ:PHXTAVWP Kim Schultz, female DOB:01-Mar-1941, 78 y.o. VXY:[id number]  Chief Complaint  Patient presents with  . Elbow Pain    HPI  Kim Schultz is a 78 y.o. female who has chronic left elbow pain and discomfort.  She has had some increased pain with the weather changes.  She has no new trauma, no redness.   Body mass index is 27.25 kg/m.  ROS  Review of Systems  Constitutional: Positive for activity change.       Patient does not have Diabetes Mellitus. Patient does not have hypertension. Patient does not have COPD or shortness of breath. Patient does not have BMI > 35. Patient does not have current smoking history.  HENT: Negative for congestion.   Respiratory: Negative for cough and shortness of breath.   Cardiovascular: Negative for chest pain.  Endocrine: Positive for cold intolerance.  Musculoskeletal: Positive for arthralgias, back pain, gait problem, joint swelling and myalgias.  Allergic/Immunologic: Positive for environmental allergies.  Psychiatric/Behavioral: The patient is nervous/anxious.   All other systems reviewed and are negative.   All other systems reviewed and are negative.  The following is a summary of the past history medically, past history surgically, known current medicines, social history and family history.  This information is gathered electronically by the computer from prior information and documentation.  I review this each visit and have found including this information at this point in the chart is beneficial and informative.    Past Medical History:  Diagnosis Date  . Arthritis   . GERD (gastroesophageal reflux disease)   . Hypertension   . Thyroid disease     Past Surgical History:  Procedure Laterality Date  . ABDOMINAL HYSTERECTOMY    . ELBOW SURGERY Left   . TONSILLECTOMY      Family History  Family history unknown: Yes    Social History Social History   Tobacco Use  . Smoking status: Never Smoker  . Smokeless  tobacco: Never Used  Substance Use Topics  . Alcohol use: No  . Drug use: No    Allergies  Allergen Reactions  . Naprosyn [Naproxen]     Current Outpatient Medications  Medication Sig Dispense Refill  . ALPRAZolam (XANAX) 0.5 MG tablet Take 0.5 mg by mouth 3 (three) times daily.    . cyclobenzaprine (FLEXERIL) 10 MG tablet One tablet twice a day as needed for spasm 60 tablet 2  . dicyclomine (BENTYL) 10 MG capsule Take 1 capsule (10 mg total) by mouth 4 (four) times daily -  before meals and at bedtime. 90 capsule 3  . HYDROcodone-acetaminophen (NORCO) 7.5-325 MG tablet One tablet every four to six hours for pain.  Must last 30 days. 120 tablet 0  . ibuprofen (ADVIL,MOTRIN) 600 MG tablet One tablet with food up to three times a day as needed for pain. 100 tablet 5  . levothyroxine (SYNTHROID, LEVOTHROID) 75 MCG tablet TAKE 1 TABLET BY MOUTH ONCE DAILY BEFORE BREAKFAST  0  . lisinopril (PRINIVIL,ZESTRIL) 20 MG tablet     . omeprazole (PRILOSEC) 20 MG capsule     . zolpidem (AMBIEN) 10 MG tablet Take 10 mg by mouth at bedtime as needed for sleep.     No current facility-administered medications for this visit.      Physical Exam  Blood pressure (!) 156/83, pulse 93, height 5\' 7"  (1.702 m), weight 174 lb (78.9 kg).  Constitutional: overall normal hygiene, normal nutrition, well developed, normal grooming, normal body habitus. Assistive device:none  Musculoskeletal:  gait and station Limp none, muscle tone and strength are normal, no tremors or atrophy is present.  .  Neurological: coordination overall normal.  Deep tendon reflex/nerve stretch intact.  Sensation normal.  Cranial nerves II-XII intact.   Skin:   Normal overall no scars, lesions, ulcers or rashes. No psoriasis.  Psychiatric: Alert and oriented x 3.  Recent memory intact, remote memory unclear.  Normal mood and affect. Well groomed.  Good eye contact.  Cardiovascular: overall no swelling, no varicosities, no edema  bilaterally, normal temperatures of the legs and arms, no clubbing, cyanosis and good capillary refill.  Lymphatic: palpation is normal.  Left elbow has decreased motion, 15 to 95, crepitus, pain.  NV intact.  All other systems reviewed and are negative   The patient has been educated about the nature of the problem(s) and counseled on treatment options.  The patient appeared to understand what I have discussed and is in agreement with it.  Encounter Diagnosis  Name Primary?  . Elbow pain, chronic, left Yes    PLAN Call if any problems.  Precautions discussed.  Continue current medications.   Return to clinic 2 months   I have reviewed the [facility] Controlled Substance Reporting System web site prior to prescribing narcotic medicine for this patient.   The patient has read and signed an Opioid Treatment Agreement which has been scanned and added to the medical record.  The patient understands the agreement and agrees to abide with it.  The patient has chronic pain that is being treated with an opioid which relieves the pain.  The patient understands potential complications with chronic opioid treatment.   Electronically Signed Darreld Mclean, MD 3/18/20201:59 PM

## 2018-11-05 ENCOUNTER — Encounter: Payer: Self-pay | Admitting: Orthopaedic Surgery

## 2018-11-05 ENCOUNTER — Other Ambulatory Visit: Payer: Self-pay

## 2018-11-05 ENCOUNTER — Ambulatory Visit (INDEPENDENT_AMBULATORY_CARE_PROVIDER_SITE_OTHER): Payer: Medicare Other | Admitting: Orthopaedic Surgery

## 2018-11-05 VITALS — BP 140/84 | HR 99 | Temp 97.3°F | Ht 67.0 in | Wt 183.0 lb

## 2018-11-05 DIAGNOSIS — M25522 Pain in left elbow: Secondary | ICD-10-CM | POA: Diagnosis not present

## 2018-11-05 DIAGNOSIS — M5441 Lumbago with sciatica, right side: Secondary | ICD-10-CM

## 2018-11-05 DIAGNOSIS — G8929 Other chronic pain: Secondary | ICD-10-CM | POA: Diagnosis not present

## 2018-11-05 MED ORDER — CYCLOBENZAPRINE HCL 10 MG PO TABS: ORAL_TABLET | ORAL | 2 refills | Status: DC

## 2018-11-05 MED ORDER — PREDNISONE 5 MG (21) PO TBPK: ORAL_TABLET | ORAL | 0 refills | Status: DC

## 2018-11-05 NOTE — Progress Notes (Signed)
Patient Kim Schultz, female DOB:1941/05/28, 78 y.o. IWO:[id number]  Chief Complaint  Patient presents with  . Back Pain    HPI  Kim Schultz is a 78 y.o. female who had developed lower back pain and right sided sciatica over the last few weeks.  She fell in October and hurt her back.  She had X-rays then.  She has had some pain on and off since October.  It got worse over the last few weeks with pain running down to the right lateral foot.  He has taken her pain medicine, the ibuprofen and her Flexeril with no help.  She has used heat, ice and rubs with no help.  She says she is getting worse.  She has no trauma, no untoward event.   Body mass index is 28.66 kg/m.  ROS  Review of Systems  Constitutional: Positive for activity change.       Patient does not have Diabetes Mellitus. Patient does not have hypertension. Patient does not have COPD or shortness of breath. Patient does not have BMI > 35. Patient does not have current smoking history.  HENT: Negative for congestion.   Respiratory: Negative for cough and shortness of breath.   Cardiovascular: Negative for chest pain.  Endocrine: Positive for cold intolerance.  Musculoskeletal: Positive for arthralgias, back pain, gait problem, joint swelling and myalgias.  Allergic/Immunologic: Positive for environmental allergies.  Psychiatric/Behavioral: The patient is nervous/anxious.   All other systems reviewed and are negative.   All other systems reviewed and are negative.  The following is a summary of the past history medically, past history surgically, known current medicines, social history and family history.  This information is gathered electronically by the computer from prior information and documentation.  I review this each visit and have found including this information at this point in the chart is beneficial and informative.    Past Medical History:  Diagnosis Date  . Arthritis   . GERD (gastroesophageal  reflux disease)   . Hypertension   . Thyroid disease     Past Surgical History:  Procedure Laterality Date  . ABDOMINAL HYSTERECTOMY    . ELBOW SURGERY Left   . TONSILLECTOMY      Family History  Family history unknown: Yes    Social History Social History   Tobacco Use  . Smoking status: Never Smoker  . Smokeless tobacco: Never Used  Substance Use Topics  . Alcohol use: No  . Drug use: No    Allergies  Allergen Reactions  . Naprosyn [Naproxen]     Current Outpatient Medications  Medication Sig Dispense Refill  . ALPRAZolam (XANAX) 0.5 MG tablet Take 0.5 mg by mouth 3 (three) times daily.    . cyclobenzaprine (FLEXERIL) 10 MG tablet One tablet twice a day as needed for spasm 60 tablet 2  . dicyclomine (BENTYL) 10 MG capsule Take 1 capsule (10 mg total) by mouth 4 (four) times daily -  before meals and at bedtime. 90 capsule 3  . HYDROcodone-acetaminophen (NORCO) 7.5-325 MG tablet One tablet every four to six hours for pain.  Must last 30 days. 120 tablet 0  . ibuprofen (ADVIL,MOTRIN) 600 MG tablet One tablet with food up to three times a day as needed for pain. 100 tablet 5  . levothyroxine (SYNTHROID, LEVOTHROID) 75 MCG tablet TAKE 1 TABLET BY MOUTH ONCE DAILY BEFORE BREAKFAST  0  . lisinopril (PRINIVIL,ZESTRIL) 20 MG tablet     . omeprazole (PRILOSEC) 20 MG capsule     .  zolpidem (AMBIEN) 10 MG tablet Take 10 mg by mouth at bedtime as needed for sleep.    . predniSONE (STERAPRED UNI-PAK 21 TAB) 5 MG (21) TBPK tablet Take 6 pills first day; 5 pills second day; 4 pills third day; 3 pills fourth day; 2 pills next day and 1 pill last day. 21 tablet 0   No current facility-administered medications for this visit.      Physical Exam  Blood pressure 140/84, pulse 99, temperature (!) 97.3 F (36.3 C), height 5\' 7"  (1.702 m), weight 183 lb (83 kg).  Constitutional: overall normal hygiene, normal nutrition, well developed, normal grooming, normal body habitus. Assistive  device:none  Musculoskeletal: gait and station Limp none, muscle tone and strength are normal, no tremors or atrophy is present.  .  Neurological: coordination overall normal.  Deep tendon reflex/nerve stretch intact.  Sensation normal.  Cranial nerves II-XII intact.   Skin:   Normal overall no scars, lesions, ulcers or rashes. No psoriasis.  Psychiatric: Alert and oriented x 3.  Recent memory intact, remote memory unclear.  Normal mood and affect. Well groomed.  Good eye contact.  Cardiovascular: overall no swelling, no varicosities, no edema bilaterally, normal temperatures of the legs and arms, no clubbing, cyanosis and good capillary refill.  Lymphatic: palpation is normal.  Spine/Pelvis examination:  Inspection:  Overall, sacoiliac joint benign and hips nontender; without crepitus or defects.   Thoracic spine inspection: Alignment normal without kyphosis present   Lumbar spine inspection:  Alignment  with normal lumbar lordosis, without scoliosis apparent.   Thoracic spine palpation:  without tenderness of spinal processes   Lumbar spine palpation: without tenderness of lumbar area; without tightness of lumbar muscles    Range of Motion:   Lumbar flexion, forward flexion is abnormal  Only 30 degrees without pain or tenderness    Lumbar extension is 0 without pain or tenderness   Left lateral bend is normal without pain or tenderness   Right lateral bend is normal without pain or tenderness   Straight leg raising is normal  Strength & tone: normal   Stability overall normal stability  Her left elbow is tender and lacks full flexion, lacks full extension by 10 degrees.  NV intact.  All other systems reviewed and are negative   The patient has been educated about the nature of the problem(s) and counseled on treatment options.  The patient appeared to understand what I have discussed and is in agreement with it.  Encounter Diagnoses  Name Primary?  . Chronic right-sided  low back pain with right-sided sciatica Yes  . Elbow pain, chronic, left     PLAN Call if any problems.  Precautions discussed.  Continue current medications. I will begin prednisone dose pack.  I am concerned about HNP and she might need MRI of the lumbar spine.  Return to clinic 1 week   Do virtual visit in one week.  Electronically Signed Darreld McleanWayne Jenaye Rickert, MD 4/29/202010:03 AM

## 2018-11-12 ENCOUNTER — Encounter: Payer: Self-pay | Admitting: Orthopaedic Surgery

## 2018-11-12 ENCOUNTER — Other Ambulatory Visit: Payer: Self-pay

## 2018-11-12 ENCOUNTER — Ambulatory Visit (INDEPENDENT_AMBULATORY_CARE_PROVIDER_SITE_OTHER): Payer: Medicare Other | Admitting: Orthopaedic Surgery

## 2018-11-12 DIAGNOSIS — G8929 Other chronic pain: Secondary | ICD-10-CM

## 2018-11-12 DIAGNOSIS — M5441 Lumbago with sciatica, right side: Secondary | ICD-10-CM

## 2018-11-12 DIAGNOSIS — M25522 Pain in left elbow: Secondary | ICD-10-CM

## 2018-11-12 NOTE — Progress Notes (Signed)
Virtual Visit via Telephone Note  I connected with Kim Schultz on 11/12/18 at 10:00 AM EDT by telephone and verified that I am speaking with the correct person using two identifiers.  Location: Patient: at home Provider: at home   I discussed the limitations, risks, security and privacy concerns of performing an evaluation and management service by telephone and the availability of in person appointments. I also discussed with the patient that there may be a patient responsible charge related to this service. The patient expressed understanding and agreed to proceed.   History of Present Illness: Her lower back is better.  She has less pain. She has decided much of her pain was related to her mattress on her bed.  She had done some changes. She has been taking her medicine and that has helped.  Her elbow pain is still present but stable.  She has no new trauma.  She has no weakness.   Observations/Objective: Per above  Assessment and Plan: Encounter Diagnoses  Name Primary?  . Chronic right-sided low back pain with right-sided sciatica Yes  . Elbow pain, chronic, left     Follow Up Instructions: She is to continue her medicine.  I will do virtual visit in one month.  Call if any problem.  Precautions discussed.      I discussed the assessment and treatment plan with the patient. The patient was provided an opportunity to ask questions and all were answered. The patient agreed with the plan and demonstrated an understanding of the instructions.   The patient was advised to call back or seek an in-person evaluation if the symptoms worsen or if the condition fails to improve as anticipated.  I provided 8 minutes of non-face-to-face time during this encounter.   Darreld Mclean, MD

## 2018-11-13 ENCOUNTER — Ambulatory Visit: Payer: Medicare Other | Admitting: Orthopaedic Surgery

## 2018-12-10 ENCOUNTER — Encounter: Payer: Self-pay | Admitting: Orthopaedic Surgery

## 2018-12-10 ENCOUNTER — Other Ambulatory Visit: Payer: Self-pay

## 2018-12-10 ENCOUNTER — Ambulatory Visit (INDEPENDENT_AMBULATORY_CARE_PROVIDER_SITE_OTHER): Payer: Medicare Other | Admitting: Orthopaedic Surgery

## 2018-12-10 DIAGNOSIS — M5441 Lumbago with sciatica, right side: Secondary | ICD-10-CM | POA: Diagnosis not present

## 2018-12-10 DIAGNOSIS — G8929 Other chronic pain: Secondary | ICD-10-CM | POA: Diagnosis not present

## 2018-12-10 DIAGNOSIS — M25522 Pain in left elbow: Secondary | ICD-10-CM

## 2018-12-10 NOTE — Addendum Note (Signed)
Addended by: Baird Kay on: 12/10/2018 11:34 AM   Modules accepted: Orders

## 2018-12-10 NOTE — Progress Notes (Signed)
Virtual Visit via Telephone Note  I connected with@ on 12/10/18 at  9:50 AM EDT by telephone and verified that I am speaking with the correct person using two identifiers.  Location: Patient: home Provider: home   I discussed the limitations, risks, security and privacy concerns of performing an evaluation and management service by telephone and the availability of in person appointments. I also discussed with the patient that there may be a patient responsible charge related to this service. The patient expressed understanding and agreed to proceed.   History of Present Illness: She has had more pain in the lower back with radiation now to the right foot.  She has numbness.  Nothing seems to help.  She has no weakness, no redness.  She cannot get comfortable.  She has no new trauma.  I am concerned about a HNP.  I will get a MRI of the lumbar spine.   Observations/Objective: Per above.  Assessment and Plan: Encounter Diagnoses  Name Primary?  . Chronic right-sided low back pain with right-sided sciatica Yes  . Elbow pain, chronic, left      Follow Up Instructions: To get MRI of the lumbar spine.  The staff will contact her to set this up then I will see her in the office.   I discussed the assessment and treatment plan with the patient. The patient was provided an opportunity to ask questions and all were answered. The patient agreed with the plan and demonstrated an understanding of the instructions.   The patient was advised to call back or seek an in-person evaluation if the symptoms worsen or if the condition fails to improve as anticipated.  I provided 12 minutes of non-face-to-face time during this encounter.   Darreld Mclean, MD

## 2018-12-11 ENCOUNTER — Telehealth: Payer: Self-pay | Admitting: Orthopaedic Surgery

## 2018-12-11 MED ORDER — HYDROCODONE-ACETAMINOPHEN 7.5-325 MG PO TABS: ORAL_TABLET | ORAL | 0 refills | Status: DC

## 2018-12-11 MED ORDER — PREDNISONE 5 MG (21) PO TBPK: ORAL_TABLET | ORAL | 0 refills | Status: DC

## 2018-12-11 NOTE — Telephone Encounter (Signed)
FYI:  Patient called to check the status of her MRI appointment. I explained to her that it has to be ok'd by her insurance and that either our office or the Radiology dept would give her a call with her appointment information.

## 2018-12-11 NOTE — Telephone Encounter (Signed)
Pt states the pain in her leg is worse after she stopped the prednisone. Pt states pain is foot and moving up the leg. Unable to get her in for an MRI until 6/16 and would like to know what can be done for the pain since it's getting worse. Also would like a refill of pain meds.

## 2018-12-23 ENCOUNTER — Ambulatory Visit (HOSPITAL_COMMUNITY)
Admission: RE | Admit: 2018-12-23 | Discharge: 2018-12-23 | Disposition: A | Discharge status: Home / Self Care | Payer: Medicare Other | Source: Ambulatory Visit | Attending: Orthopaedic Surgery | Admitting: Orthopaedic Surgery

## 2018-12-23 ENCOUNTER — Other Ambulatory Visit: Payer: Self-pay

## 2018-12-23 DIAGNOSIS — M5441 Lumbago with sciatica, right side: Secondary | ICD-10-CM | POA: Insufficient documentation

## 2018-12-23 DIAGNOSIS — G8929 Other chronic pain: Secondary | ICD-10-CM | POA: Diagnosis present

## 2018-12-24 ENCOUNTER — Other Ambulatory Visit: Payer: Self-pay

## 2018-12-24 ENCOUNTER — Ambulatory Visit (INDEPENDENT_AMBULATORY_CARE_PROVIDER_SITE_OTHER): Payer: Medicare Other | Admitting: Orthopaedic Surgery

## 2018-12-24 ENCOUNTER — Encounter: Payer: Self-pay | Admitting: Orthopaedic Surgery

## 2018-12-24 DIAGNOSIS — M5441 Lumbago with sciatica, right side: Secondary | ICD-10-CM

## 2018-12-24 DIAGNOSIS — M25522 Pain in left elbow: Secondary | ICD-10-CM

## 2018-12-24 DIAGNOSIS — G8929 Other chronic pain: Secondary | ICD-10-CM

## 2018-12-24 MED ORDER — HYDROCODONE-ACETAMINOPHEN 7.5-325 MG PO TABS: ORAL_TABLET | ORAL | 0 refills | Status: DC

## 2018-12-24 NOTE — Progress Notes (Signed)
Virtual Visit via Telephone Note  I connected with@ on 12/24/18 at 10:00 AM EDT by telephone and verified that I am speaking with the correct person using two identifiers.  Location: Patient: home Provider: home   I discussed the limitations, risks, security and privacy concerns of performing an evaluation and management service by telephone and the availability of in person appointments. I also discussed with the patient that there may be a patient responsible charge related to this service. The patient expressed understanding and agreed to proceed.   History of Present Illness: She has lower back pain that is a little better.  She has good and bad days, more bad days than good. The rainy weather we have has made her worse.  She got the MRI done 12-23-2018 and it showed: IMPRESSION: 1. 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 2. Multilevel multifactorial spinal, lateral recess and foraminal stenosis as detailed above at the individual levels. The most significant findings are at L4-5 and L5-S1.   I have explained the findings to her.  I have suggested consideration of epidural injection.  She says she would rather wait and see if she gets better on her own.  Her pain medicine was called into [facility] not [facility].  I will correct this.  I will refill her pain medicine.  She has no weakness, but still has pain down the leg at times. Observations/Objective: Per above.  Assessment and Plan: Encounter Diagnoses  Name Primary?  . Chronic right-sided low back pain with right-sided sciatica Yes  . Elbow pain, chronic, left      Follow Up Instructions: Three weeks by telephone.  Call if worse.  Call if she would like epidurals.  I have reviewed the [place] web site prior to prescribing narcotic medicine for this patient.      I discussed the assessment and treatment plan  with the patient. The patient was provided an opportunity to ask questions and all were answered. The patient agreed with the plan and demonstrated an understanding of the instructions.   The patient was advised to call back or seek an in-person evaluation if the symptoms worsen or if the condition fails to improve as anticipated.  I provided 14 minutes of non-face-to-face time during this encounter.   Sanjuana Kava, MD

## 2018-12-25 ENCOUNTER — Ambulatory Visit: Payer: Medicare Other | Admitting: Orthopaedic Surgery

## 2019-01-21 ENCOUNTER — Ambulatory Visit (INDEPENDENT_AMBULATORY_CARE_PROVIDER_SITE_OTHER): Payer: Medicare Other | Admitting: Orthopaedic Surgery

## 2019-01-21 ENCOUNTER — Encounter: Payer: Self-pay | Admitting: Orthopaedic Surgery

## 2019-01-21 ENCOUNTER — Other Ambulatory Visit: Payer: Self-pay

## 2019-01-21 DIAGNOSIS — G8929 Other chronic pain: Secondary | ICD-10-CM

## 2019-01-21 DIAGNOSIS — M25561 Pain in right knee: Secondary | ICD-10-CM

## 2019-01-21 DIAGNOSIS — M5441 Lumbago with sciatica, right side: Secondary | ICD-10-CM | POA: Diagnosis not present

## 2019-01-21 MED ORDER — HYDROCODONE-ACETAMINOPHEN 7.5-325 MG PO TABS: ORAL_TABLET | ORAL | 0 refills | Status: DC

## 2019-01-21 NOTE — Progress Notes (Signed)
Virtual Visit via Telephone Note  I connected with@ on 01/21/19 at  9:50 AM EDT by telephone and verified that I am speaking with the correct person using two identifiers.  Location: Patient: home Provider: home   I discussed the limitations, risks, security and privacy concerns of performing an evaluation and management service by telephone and the availability of in person appointments. I also discussed with the patient that there may be a patient responsible charge related to this service. The patient expressed understanding and agreed to proceed.   History of Present Illness: She has chronic lower back pain.  She has good and bad days.  She has no new trauma, no weakness.  She has developed pain in the right knee over the last several weeks.  She has no trauma.  She has swelling and popping. She has no giving way.  It bothers her after activity and at night.  She has no redness.  Pain medicine helps relieve the pain. I told her I might need to see her in the office if the pain gets worse or continues.   Observations/Objective: Per above.  Assessment and Plan: Encounter Diagnoses  Name Primary?  . Chronic right-sided low back pain with right-sided sciatica Yes  . Acute pain of right knee      Follow Up Instructions: One month.  I have reviewed the [facility] web site prior to prescribing narcotic medicine for this patient.      I discussed the assessment and treatment plan with the patient. The patient was provided an opportunity to ask questions and all were answered. The patient agreed with the plan and demonstrated an understanding of the instructions.   The patient was advised to call back or seek an in-person evaluation if the symptoms worsen or if the condition fails to improve as anticipated.  I provided 11 minutes of non-face-to-face time during this encounter.   Sanjuana Kava, MD

## 2019-02-18 ENCOUNTER — Other Ambulatory Visit: Payer: Self-pay

## 2019-02-18 ENCOUNTER — Ambulatory Visit (INDEPENDENT_AMBULATORY_CARE_PROVIDER_SITE_OTHER): Payer: Medicare Other | Admitting: Orthopaedic Surgery

## 2019-02-18 ENCOUNTER — Encounter: Payer: Self-pay | Admitting: Orthopaedic Surgery

## 2019-02-18 DIAGNOSIS — M25522 Pain in left elbow: Secondary | ICD-10-CM

## 2019-02-18 DIAGNOSIS — G8929 Other chronic pain: Secondary | ICD-10-CM | POA: Diagnosis not present

## 2019-02-18 DIAGNOSIS — M5441 Lumbago with sciatica, right side: Secondary | ICD-10-CM | POA: Diagnosis not present

## 2019-02-18 NOTE — Progress Notes (Signed)
Virtual Visit via Telephone Note  I connected with@ on 02/18/19 at 10:10 AM EDT by telephone and verified that I am speaking with the correct person using two identifiers.  Location: Patient: home Provider: home   I discussed the limitations, risks, security and privacy concerns of performing an evaluation and management service by telephone and the availability of in person appointments. I also discussed with the patient that there may be a patient responsible charge related to this service. The patient expressed understanding and agreed to proceed.   History of Present Illness: She is doing better.  Her lower back pain is decreased. She is more active.  She has no weakness or numbness.  The left elbow continues to be painful at times with good and bad days but no new trauma.  She is taking her medicine.    Observations/Objective: Per above.  Assessment and Plan: Encounter Diagnoses  Name Primary?  . Chronic right-sided low back pain with right-sided sciatica Yes  . Elbow pain, chronic, left      Follow Up Instructions: Two months.   I discussed the assessment and treatment plan with the patient. The patient was provided an opportunity to ask questions and all were answered. The patient agreed with the plan and demonstrated an understanding of the instructions.   The patient was advised to call back or seek an in-person evaluation if the symptoms worsen or if the condition fails to improve as anticipated.  I provided 9 minutes of non-face-to-face time during this encounter.   Sanjuana Kava, MD

## 2019-03-25 ENCOUNTER — Emergency Department (HOSPITAL_COMMUNITY): Payer: Medicare Other

## 2019-03-25 ENCOUNTER — Encounter (HOSPITAL_COMMUNITY): Payer: Self-pay | Admitting: Emergency Medicine

## 2019-03-25 ENCOUNTER — Other Ambulatory Visit: Payer: Self-pay

## 2019-03-25 ENCOUNTER — Observation Stay (HOSPITAL_COMMUNITY)
Admission: EM | Admit: 2019-03-25 | Discharge: 2019-03-27 | Disposition: A | Discharge status: Home / Self Care | Payer: Medicare Other | Attending: Internal Medicine | Admitting: Internal Medicine

## 2019-03-25 DIAGNOSIS — N183 Chronic kidney disease, stage 3 (moderate): Secondary | ICD-10-CM | POA: Insufficient documentation

## 2019-03-25 DIAGNOSIS — I129 Hypertensive chronic kidney disease with stage 1 through stage 4 chronic kidney disease, or unspecified chronic kidney disease: Secondary | ICD-10-CM | POA: Insufficient documentation

## 2019-03-25 DIAGNOSIS — W19XXXA Unspecified fall, initial encounter: Secondary | ICD-10-CM | POA: Diagnosis present

## 2019-03-25 DIAGNOSIS — Z20828 Contact with and (suspected) exposure to other viral communicable diseases: Secondary | ICD-10-CM | POA: Diagnosis not present

## 2019-03-25 DIAGNOSIS — R202 Paresthesia of skin: Secondary | ICD-10-CM

## 2019-03-25 DIAGNOSIS — R200 Anesthesia of skin: Principal | ICD-10-CM | POA: Insufficient documentation

## 2019-03-25 DIAGNOSIS — Z79899 Other long term (current) drug therapy: Secondary | ICD-10-CM | POA: Insufficient documentation

## 2019-03-25 DIAGNOSIS — S43002A Unspecified subluxation of left shoulder joint, initial encounter: Secondary | ICD-10-CM | POA: Insufficient documentation

## 2019-03-25 DIAGNOSIS — N179 Acute kidney failure, unspecified: Secondary | ICD-10-CM

## 2019-03-25 DIAGNOSIS — Y929 Unspecified place or not applicable: Secondary | ICD-10-CM | POA: Insufficient documentation

## 2019-03-25 DIAGNOSIS — S0083XA Contusion of other part of head, initial encounter: Secondary | ICD-10-CM | POA: Insufficient documentation

## 2019-03-25 DIAGNOSIS — E039 Hypothyroidism, unspecified: Secondary | ICD-10-CM | POA: Diagnosis not present

## 2019-03-25 DIAGNOSIS — Y999 Unspecified external cause status: Secondary | ICD-10-CM | POA: Diagnosis not present

## 2019-03-25 DIAGNOSIS — A09 Infectious gastroenteritis and colitis, unspecified: Secondary | ICD-10-CM

## 2019-03-25 DIAGNOSIS — Y939 Activity, unspecified: Secondary | ICD-10-CM | POA: Insufficient documentation

## 2019-03-25 DIAGNOSIS — K580 Irritable bowel syndrome with diarrhea: Secondary | ICD-10-CM

## 2019-03-25 DIAGNOSIS — M25522 Pain in left elbow: Secondary | ICD-10-CM

## 2019-03-25 DIAGNOSIS — R197 Diarrhea, unspecified: Secondary | ICD-10-CM | POA: Diagnosis present

## 2019-03-25 LAB — CBC
HCT: 39.7 % (ref 36.0–46.0)
Hemoglobin: 12.6 g/dL (ref 12.0–15.0)
MCH: 30.7 pg (ref 26.0–34.0)
MCHC: 31.7 g/dL (ref 30.0–36.0)
MCV: 96.8 fL (ref 80.0–100.0)
Platelets: 207 10*3/uL (ref 150–400)
RBC: 4.1 MIL/uL (ref 3.87–5.11)
RDW: 12.8 % (ref 11.5–15.5)
WBC: 18.2 10*3/uL — ABNORMAL HIGH (ref 4.0–10.5)
nRBC: 0 % (ref 0.0–0.2)

## 2019-03-25 LAB — COMPREHENSIVE METABOLIC PANEL
ALT: 18 U/L (ref 0–44)
AST: 58 U/L — ABNORMAL HIGH (ref 15–41)
Albumin: 3.7 g/dL (ref 3.5–5.0)
Alkaline Phosphatase: 101 U/L (ref 38–126)
Anion gap: 13 (ref 5–15)
BUN: 36 mg/dL — ABNORMAL HIGH (ref 8–23)
CO2: 19 mmol/L — ABNORMAL LOW (ref 22–32)
Calcium: 8.8 mg/dL — ABNORMAL LOW (ref 8.9–10.3)
Chloride: 101 mmol/L (ref 98–111)
Creatinine, Ser: 2.32 mg/dL — ABNORMAL HIGH (ref 0.44–1.00)
GFR calc Af Amer: 23 mL/min — ABNORMAL LOW (ref >60)
GFR calc non Af Amer: 20 mL/min — ABNORMAL LOW (ref >60)
Glucose, Bld: 114 mg/dL — ABNORMAL HIGH (ref 70–99)
Potassium: 4.7 mmol/L (ref 3.5–5.1)
Sodium: 133 mmol/L — ABNORMAL LOW (ref 135–145)
Total Bilirubin: 1.1 mg/dL (ref 0.3–1.2)
Total Protein: 7.1 g/dL (ref 6.5–8.1)

## 2019-03-25 MED ORDER — SODIUM CHLORIDE 0.9 % IV BOLUS
1000.0000 mL | Freq: Once | INTRAVENOUS | Status: AC
  Administered 2019-03-25: 1000 mL via INTRAVENOUS

## 2019-03-25 NOTE — ED Triage Notes (Signed)
Pt arrives via POV w/complaints of n/v/d since this am. Pt states she fell when trying to get out of the bathroom. Pt had redness to forehead, pt states L foot is numb, pulses present.

## 2019-03-25 NOTE — ED Notes (Signed)
Patient complains of left foot numbness since 0300 this morning. States when she went to get up this evening at approximately 1600, her foot gave out and she fell. Bruising noted to forehead. Significant bruising also noted to left elbow. States "that didn't happen tonight. I was in the shower last Tuesday and I turned and felt pain but I didn't hit it on anything." Family member states she had surgery on left elbow previously and "I'm afraid the pin might have come out or something."

## 2019-03-26 ENCOUNTER — Encounter (HOSPITAL_COMMUNITY): Payer: Self-pay

## 2019-03-26 ENCOUNTER — Observation Stay (HOSPITAL_COMMUNITY): Payer: Medicare Other

## 2019-03-26 DIAGNOSIS — R200 Anesthesia of skin: Secondary | ICD-10-CM | POA: Diagnosis not present

## 2019-03-26 DIAGNOSIS — W19XXXA Unspecified fall, initial encounter: Secondary | ICD-10-CM | POA: Diagnosis not present

## 2019-03-26 DIAGNOSIS — N183 Chronic kidney disease, stage 3 (moderate): Secondary | ICD-10-CM

## 2019-03-26 DIAGNOSIS — N179 Acute kidney failure, unspecified: Secondary | ICD-10-CM

## 2019-03-26 DIAGNOSIS — A09 Infectious gastroenteritis and colitis, unspecified: Secondary | ICD-10-CM | POA: Diagnosis not present

## 2019-03-26 DIAGNOSIS — R202 Paresthesia of skin: Secondary | ICD-10-CM

## 2019-03-26 LAB — SARS CORONAVIRUS 2 (TAT 6-24 HRS): SARS Coronavirus 2: NEGATIVE

## 2019-03-26 MED ORDER — ONDANSETRON HCL 4 MG/2ML IJ SOLN: 4.0000 mg | Freq: Four times a day (QID) | INTRAMUSCULAR | Status: DC | PRN

## 2019-03-26 MED ORDER — HYDROCODONE-ACETAMINOPHEN 5-325 MG PO TABS
1.5000 | ORAL_TABLET | Freq: Four times a day (QID) | ORAL | Status: DC | PRN
  Administered 2019-03-26 – 2019-03-27 (×2): 1.5 via ORAL
  Filled 2019-03-26 (×2): qty 2

## 2019-03-26 MED ORDER — HYDROCODONE-ACETAMINOPHEN 5-325 MG PO TABS
ORAL_TABLET | ORAL | Status: AC
  Administered 2019-03-26: 1.5
  Filled 2019-03-26: qty 1

## 2019-03-26 MED ORDER — DICYCLOMINE HCL 10 MG PO CAPS
10.0000 mg | ORAL_CAPSULE | Freq: Three times a day (TID) | ORAL | Status: DC
  Administered 2019-03-26 – 2019-03-27 (×6): 10 mg via ORAL
  Filled 2019-03-26 (×6): qty 1

## 2019-03-26 MED ORDER — PANTOPRAZOLE SODIUM 40 MG PO TBEC
40.0000 mg | DELAYED_RELEASE_TABLET | Freq: Every day | ORAL | Status: DC
  Administered 2019-03-26 – 2019-03-27 (×2): 40 mg via ORAL
  Filled 2019-03-26 (×3): qty 1

## 2019-03-26 MED ORDER — ONDANSETRON HCL 4 MG PO TABS: 4.0000 mg | ORAL_TABLET | Freq: Four times a day (QID) | ORAL | Status: DC | PRN

## 2019-03-26 MED ORDER — LEVOTHYROXINE SODIUM 88 MCG PO TABS
88.0000 ug | ORAL_TABLET | Freq: Every day | ORAL | Status: DC
Start: 2019-03-26 — End: 2019-03-27
  Administered 2019-03-27: 88 ug via ORAL
  Filled 2019-03-26 (×4): qty 1

## 2019-03-26 MED ORDER — CYCLOBENZAPRINE HCL 10 MG PO TABS: 5.0000 mg | ORAL_TABLET | Freq: Three times a day (TID) | ORAL | Status: DC | PRN

## 2019-03-26 MED ORDER — ENOXAPARIN SODIUM 40 MG/0.4ML ~~LOC~~ SOLN
40.0000 mg | SUBCUTANEOUS | Status: DC
  Administered 2019-03-26: 40 mg via SUBCUTANEOUS
  Filled 2019-03-26: qty 0.4

## 2019-03-26 MED ORDER — HYDROCODONE-ACETAMINOPHEN 7.5-325 MG PO TABS: 1.0000 | ORAL_TABLET | Freq: Four times a day (QID) | ORAL | Status: DC | PRN

## 2019-03-26 MED ORDER — ALPRAZOLAM 0.5 MG PO TABS: 0.5000 mg | ORAL_TABLET | Freq: Three times a day (TID) | ORAL | Status: DC | PRN

## 2019-03-26 MED ORDER — SODIUM CHLORIDE 0.9 % IV SOLN
INTRAVENOUS | Status: DC
  Administered 2019-03-26 – 2019-03-27 (×2): via INTRAVENOUS

## 2019-03-26 MED ORDER — ENOXAPARIN SODIUM 30 MG/0.3ML ~~LOC~~ SOLN
30.0000 mg | SUBCUTANEOUS | Status: DC
  Administered 2019-03-27: 30 mg via SUBCUTANEOUS
  Filled 2019-03-26: qty 0.3

## 2019-03-26 NOTE — Care Management Obs Status (Signed)
[facility] NOTIFICATION   Patient Details  Name: Kim Schultz MRN: [id number] Date of Birth: [birth year]   Medicare Observation Status Notification Given:  Yes(signed by daughter Neoma Laming who was bedside as patient requested she sign for her)    Trish Mage, LCSW 03/26/2019, 8:50 AM

## 2019-03-26 NOTE — ED Provider Notes (Signed)
[facility] Provider Note   CSN: [id number] Arrival date & time: 03/25/19  2027     History   Chief Complaint Chief Complaint  Patient presents with   Nausea    HPI [name] is a 78 y.o. female.     Patient states that she is been having diarrhea all day yesterday and on the commode for 8 hours.  Since that time her left leg has been numb.  She fell and complains of weakness  The history is provided by the patient. No language interpreter was used.  Weakness Severity:  Moderate Onset quality:  Sudden Timing:  Constant Progression:  Worsening Chronicity:  New Context: not alcohol use   Relieved by:  Nothing Worsened by:  Nothing Ineffective treatments:  None tried Associated symptoms: no abdominal pain, no chest pain, no cough, no diarrhea, no frequency, no headaches and no seizures     Past Medical History:  Diagnosis Date   Arthritis    GERD (gastroesophageal reflux disease)    Hypertension    Thyroid disease     Patient Active Problem List   Diagnosis Date Noted   Fall 03/26/2019   Essential hypertension 01/29/2018   Hypothyroidism 01/29/2018   GERD (gastroesophageal reflux disease) 01/29/2018   AKI (acute kidney injury) (HCC) 01/29/2018   CKD (chronic kidney disease) stage 3, GFR 30-59 ml/min (HCC) 01/29/2018   Intractable nausea and vomiting 01/29/2018   Left knee pain 09/13/2015    Past Surgical History:  Procedure Laterality Date   ABDOMINAL HYSTERECTOMY     ELBOW SURGERY Left    TONSILLECTOMY       OB History   No obstetric history on file.      Home Medications    Prior to Admission medications   Medication Sig Start Date End Date Taking? Authorizing Provider  ALPRAZolam Prudy Feeler) 0.5 MG tablet Take 0.5 mg by mouth 3 (three) times daily.    [provider]  cyclobenzaprine (FLEXERIL) 10 MG tablet One tablet twice a day as needed for spasm 11/05/18   Darreld Mclean, MD  dicyclomine  (BENTYL) 10 MG capsule Take 1 capsule (10 mg total) by mouth 4 (four) times daily -  before meals and at bedtime. 09/24/18   Darreld Mclean, MD  EUTHYROX 88 MCG tablet Take 88 mcg by mouth daily. 01/02/19   [provider]  HYDROcodone-acetaminophen (NORCO) 7.5-325 MG tablet One tablet every four to six hours for pain.  Must last 30 days. 01/21/19   Darreld Mclean, MD  ibuprofen (ADVIL,MOTRIN) 600 MG tablet One tablet with food up to three times a day as needed for pain. 05/07/18   Darreld Mclean, MD  levothyroxine (SYNTHROID, LEVOTHROID) 75 MCG tablet TAKE 1 TABLET BY MOUTH ONCE DAILY BEFORE BREAKFAST 05/01/18   [provider]  lisinopril (PRINIVIL,ZESTRIL) 20 MG tablet  05/01/18   [provider]  omeprazole (PRILOSEC) 20 MG capsule  07/22/15   [provider]  predniSONE (STERAPRED UNI-PAK 21 TAB) 5 MG (21) TBPK tablet Take 6 pills first day; 5 pills second day; 4 pills third day; 3 pills fourth day; 2 pills next day and 1 pill last day. 12/11/18   Darreld Mclean, MD  zolpidem (AMBIEN) 10 MG tablet Take 10 mg by mouth at bedtime as needed for sleep.    [provider]    Family History Family History  Family history unknown: Yes    Social History Social History   Tobacco Use   Smoking  status: Never Smoker   Smokeless tobacco: Never Used  Substance Use Topics   Alcohol use: No   Drug use: No     Allergies   Naprosyn [naproxen]   Review of Systems Review of Systems  Constitutional: Negative for appetite change and fatigue.  HENT: Negative for congestion, ear discharge and sinus pressure.   Eyes: Negative for discharge.  Respiratory: Negative for cough.   Cardiovascular: Negative for chest pain.  Gastrointestinal: Negative for abdominal pain and diarrhea.  Genitourinary: Negative for frequency and hematuria.  Musculoskeletal: Negative for back pain.  Skin: Negative for rash.  Neurological: Positive for weakness. Negative for  seizures and headaches.  Psychiatric/Behavioral: Negative for hallucinations.     Physical Exam Updated Vital Signs BP (!) 103/45 (BP Location: Right Arm)    Pulse 79    Temp 98 F (36.7 C) (Oral)    Resp 18    Ht 5\' 7"  (1.702 m)    Wt 84.6 kg    SpO2 96%    BMI 29.21 kg/m   Physical Exam Vitals signs and nursing note reviewed.  Constitutional:      Appearance: She is well-developed.  HENT:     Head: Normocephalic.     Comments: Bruising to forehead    Nose: Nose normal.  Eyes:     General: No scleral icterus.    Conjunctiva/sclera: Conjunctivae normal.  Neck:     Musculoskeletal: Neck supple.     Thyroid: No thyromegaly.  Cardiovascular:     Rate and Rhythm: Normal rate and regular rhythm.     Heart sounds: No murmur. No friction rub. No gallop.   Pulmonary:     Breath sounds: No stridor. No wheezing or rales.  Chest:     Chest wall: No tenderness.  Abdominal:     General: There is no distension.     Tenderness: There is no abdominal tenderness. There is no rebound.  Musculoskeletal: Normal range of motion.     Comments: Deformity left elbow.  Weakness left leg with numbness  Lymphadenopathy:     Cervical: No cervical adenopathy.  Skin:    Findings: No erythema or rash.  Neurological:     Mental Status: She is alert and oriented to person, place, and time.     Motor: No abnormal muscle tone.     Coordination: Coordination normal.  Psychiatric:        Behavior: Behavior normal.      ED Treatments / Results  Labs (all labs ordered are listed, but only abnormal results are displayed) Labs Reviewed  CBC - Abnormal; Notable for the following components:      Result Value   WBC 18.2 (*)    All other components within normal limits  COMPREHENSIVE METABOLIC PANEL - Abnormal; Notable for the following components:   Sodium 133 (*)    CO2 19 (*)    Glucose, Bld 114 (*)    BUN 36 (*)    Creatinine, Ser 2.32 (*)    Calcium 8.8 (*)    AST 58 (*)    GFR calc non  Af Amer 20 (*)    GFR calc Af Amer 23 (*)    All other components within normal limits  SARS CORONAVIRUS 2 (TAT 6-24 HRS)  C DIFFICILE QUICK SCREEN W PCR REFLEX    EKG None  Radiology Dg Lumbar Spine 2-3 Views  Result Date: 03/26/2019 CLINICAL DATA:  Fall last night with left foot numbness and low back pain EXAM: LUMBAR  SPINE - 2-3 VIEW COMPARISON:  None. FINDINGS: Degenerative facet spurring with dextroscoliosis. Multilevel lumbar disc narrowing with mild ridging. Grade 1 anterolisthesis at L5-S1. Generalized osteopenic appearance. No evident acute fracture. IMPRESSION: 1. No acute finding. 2. Lumbar spine degeneration and scoliosis. Electronically Signed   By: Marnee SpringJonathon  Watts M.D.   On: 03/26/2019 07:18   Dg Elbow Complete Left  Result Date: 03/25/2019 CLINICAL DATA:  Recent fall unrelated to the elbow pain which has been chronic over the past week EXAM: LEFT ELBOW - COMPLETE 3+ VIEW COMPARISON:  04/22/2018 FINDINGS: There are changes consistent with prior fixation of proximal ulnar fracture. The elbow joint is significantly degenerated with some suggestion of subluxation of the humerus from the ulna on 1 of the straight arm views. Multiple dystrophic calcifications are again identified consistent with the prior trauma. The radial head is been surgically removed. IMPRESSION: Apparent subluxation of the humerus with respect to the articular surface of the ulna. Postsurgical changes in the proximal ulna and proximal radius are seen. This subluxation is seen on only 1 image. Correlation with the physical exam is recommended. Electronically Signed   By: Alcide CleverMark  Lukens M.D.   On: 03/25/2019 22:56   Ct Head Wo Contrast  Result Date: 03/25/2019 CLINICAL DATA:  78 year old female with unexplained altered mental status. Nausea vomiting and diarrhea since this morning. EXAM: CT HEAD WITHOUT CONTRAST CT CERVICAL SPINE WITHOUT CONTRAST TECHNIQUE: Multidetector CT imaging of the head and cervical spine was  performed following the standard protocol without intravenous contrast. Multiplanar CT image reconstructions of the cervical spine were also generated. COMPARISON:  Face CT 06/27/2016. FINDINGS: CT HEAD FINDINGS Brain: Chronic subarachnoid CSF along the cerebral hemispheres appears not significantly changed from that visible in 2017. No subdural space involvement suspected. No midline shift, ventriculomegaly, mass effect, evidence of mass lesion, intracranial hemorrhage or evidence of cortically based acute infarction. Gray-white matter differentiation is within normal limits throughout the brain. Vascular: Calcified atherosclerosis at the skull base. No suspicious intracranial vascular hyperdensity. Skull: No acute osseous abnormality identified. Sinuses/Orbits: Visualized paranasal sinuses and mastoids are clear. Other: No acute orbit or scalp soft tissue findings. CT CERVICAL SPINE FINDINGS Alignment: Mildly exaggerated cervical lordosis. Cervicothoracic junction alignment is within normal limits. Bilateral posterior element alignment is within normal limits. Skull base and vertebrae: Visualized skull base is intact. No atlanto-occipital dissociation. No acute osseous abnormality identified. Soft tissues and spinal canal: No prevertebral fluid or swelling. No visible canal hematoma. Bulky calcified cervical carotid atherosclerosis greater on the right. Disc levels: Mild for age cervical disc and endplate degeneration with mild if any associated spinal stenosis. Up to moderate cervical facet degeneration greater on the left. Upper chest: Mild T1 superior endplate deformity, otherwise the visible upper thoracic levels appear intact. Negative lung apices. IMPRESSION: 1. No acute intracranial abnormality. Negative non contrast CT appearance of the brain aside from chronic increased extra-axial CSF which may be related to generalized brain volume loss. 2. No acute traumatic injury identified in the cervical spine.  Mild for age cervical spine degeneration. 3. Mild chronic appearing T1 superior endplate compression. Electronically Signed   By: Odessa FlemingH  Hall M.D.   On: 03/25/2019 22:11   Ct Cervical Spine Wo Contrast  Result Date: 03/25/2019 CLINICAL DATA:  78 year old female with unexplained altered mental status. Nausea vomiting and diarrhea since this morning. EXAM: CT HEAD WITHOUT CONTRAST CT CERVICAL SPINE WITHOUT CONTRAST TECHNIQUE: Multidetector CT imaging of the head and cervical spine was performed following the standard protocol without intravenous  contrast. Multiplanar CT image reconstructions of the cervical spine were also generated. COMPARISON:  Face CT 06/27/2016. FINDINGS: CT HEAD FINDINGS Brain: Chronic subarachnoid CSF along the cerebral hemispheres appears not significantly changed from that visible in 2017. No subdural space involvement suspected. No midline shift, ventriculomegaly, mass effect, evidence of mass lesion, intracranial hemorrhage or evidence of cortically based acute infarction. Gray-white matter differentiation is within normal limits throughout the brain. Vascular: Calcified atherosclerosis at the skull base. No suspicious intracranial vascular hyperdensity. Skull: No acute osseous abnormality identified. Sinuses/Orbits: Visualized paranasal sinuses and mastoids are clear. Other: No acute orbit or scalp soft tissue findings. CT CERVICAL SPINE FINDINGS Alignment: Mildly exaggerated cervical lordosis. Cervicothoracic junction alignment is within normal limits. Bilateral posterior element alignment is within normal limits. Skull base and vertebrae: Visualized skull base is intact. No atlanto-occipital dissociation. No acute osseous abnormality identified. Soft tissues and spinal canal: No prevertebral fluid or swelling. No visible canal hematoma. Bulky calcified cervical carotid atherosclerosis greater on the right. Disc levels: Mild for age cervical disc and endplate degeneration with mild if any  associated spinal stenosis. Up to moderate cervical facet degeneration greater on the left. Upper chest: Mild T1 superior endplate deformity, otherwise the visible upper thoracic levels appear intact. Negative lung apices. IMPRESSION: 1. No acute intracranial abnormality. Negative non contrast CT appearance of the brain aside from chronic increased extra-axial CSF which may be related to generalized brain volume loss. 2. No acute traumatic injury identified in the cervical spine. Mild for age cervical spine degeneration. 3. Mild chronic appearing T1 superior endplate compression. Electronically Signed   By: Odessa FlemingH  Hall M.D.   On: 03/25/2019 22:11    Procedures Procedures (including critical care time)  Medications Ordered in ED Medications  ALPRAZolam (XANAX) tablet 0.5 mg (has no administration in time range)  levothyroxine (SYNTHROID) tablet 88 mcg (has no administration in time range)  dicyclomine (BENTYL) capsule 10 mg (10 mg Oral Given 03/26/19 1115)  pantoprazole (PROTONIX) EC tablet 40 mg (40 mg Oral Given 03/26/19 1115)  cyclobenzaprine (FLEXERIL) tablet 5 mg (has no administration in time range)  0.9 %  sodium chloride infusion ( Intravenous Transfusing/Transfer 03/26/19 0927)  ondansetron (ZOFRAN) tablet 4 mg (has no administration in time range)    Or  ondansetron (ZOFRAN) injection 4 mg (has no administration in time range)  HYDROcodone-acetaminophen (NORCO/VICODIN) 5-325 MG per tablet 1.5 tablet (has no administration in time range)  enoxaparin (LOVENOX) injection 30 mg (has no administration in time range)  sodium chloride 0.9 % bolus 1,000 mL (0 mLs Intravenous Stopped 03/26/19 0109)  HYDROcodone-acetaminophen (NORCO/VICODIN) 5-325 MG per tablet (1.5 tablets  Given 03/26/19 0646)     Initial Impression / Assessment and Plan / ED Course  I have reviewed the triage vital signs and the nursing notes.  Pertinent labs & imaging results that were available during my care of the patient  were reviewed by me and considered in my medical decision making (see chart for details).    Patient with AKI and weakness to left leg probably secondary to sitting for long periods of time.  Patient also has what is most likely old deformity of the left elbow.  She will be admitted to medicine for fluids and further evaluation of weakness of left leg     Final Clinical Impressions(s) / ED Diagnoses   Final diagnoses:  Diarrhea of infectious origin  Numbness and tingling of foot    ED Discharge Orders    None  Milton Ferguson, MD 03/26/19 1243

## 2019-03-26 NOTE — Progress Notes (Signed)
Patient seen and examined. Admitted after midnight secondary to mechanical fall, diarrhea and AKI. Patient with hx of IBS, reported severe diarrhea, nausea and vomiting for approx 20 hours prior to admission. Work up demonstrated AKI and secondary to fall found with left elbow subluxation (prior hx of elbow surgery and Pin in the past). Please refer to H&P written by Dr. Darrick Meigs for further info/details on admission.  Plan: -continue IVF's -minimize/avoid nephrotoxic agents -follow renal function trend  -doubt diarrhea infectious origin, if no futher episodes in the next 20 hours will cancel C. Diff test. -continue symptomatic and supportive management  -afebrile, denying any further N/V, tolerated breakfast. -follow orthopedic service recommendations.  Barton Dubois MD [phone]

## 2019-03-26 NOTE — H&P (Signed)
TRH H&P    Patient Demographics:    Kim AllegraMargaret Schultz, is a 78 y.o. female  MRN: [id number]  DOB - 11/09/1940  Admit Date - 03/25/2019  Referring MD/NP/PA: Dr. Estell HarpinZammit  [facility]patient [facility] the [facility]  Patient coming from: Home  Chief complaint-fall   HPI:    Kim Schultz  is a 78 y.o. female, with history of hypothyroidism, GERD, degenerative disc disease came to ED after patient fell at home.  Patient says that she was sitting on the commode for almost 8 hours due to diarrhea and then developed numbness of left foot.  Patient said that she was unable to bear weight on the left foot and fell.  She hit her forehead denied loss of consciousness.  In the ED, CT head was negative.  X-ray of the left elbow showed apparent subluxation of the humerus with respect to the articular surface of the ulna. Lab work showed acute kidney injury with creatinine 2.  3 2.  Patient's previous creatinine was 1.51 in July 2019.    Review of systems:    In addition to the HPI above,    All other systems reviewed and are negative.    Past History of the following :    Past Medical History:  Diagnosis Date   Arthritis    GERD (gastroesophageal reflux disease)    Hypertension    Thyroid disease       Past Surgical History:  Procedure Laterality Date   ABDOMINAL HYSTERECTOMY     ELBOW SURGERY Left    TONSILLECTOMY        Social History:      Social History   Tobacco Use   Smoking status: Never Smoker   Smokeless tobacco: Never Used  Substance Use Topics   Alcohol use: No       Family History :     Family History  Family history unknown: Yes      Home Medications:   Prior to Admission medications   Medication Sig Start Date End Date Taking? Authorizing Provider  ALPRAZolam Prudy Feeler(XANAX) 0.5 MG tablet Take 0.5 mg by mouth 3 (three) times daily.    [provider]  cyclobenzaprine (FLEXERIL) 10 MG tablet One tablet twice a day as needed for spasm 11/05/18   Darreld McleanKeeling, Wayne, MD  dicyclomine (BENTYL) 10 MG capsule Take 1 capsule (10 mg total) by mouth 4 (four) times daily -  before meals and at bedtime. 09/24/18   Darreld McleanKeeling, Wayne, MD  EUTHYROX 88 MCG tablet Take 88 mcg by mouth daily. 01/02/19   [provider]  HYDROcodone-acetaminophen (NORCO) 7.5-325 MG tablet One tablet every four to six hours for pain.  Must last 30 days. 01/21/19   Darreld McleanKeeling, Wayne, MD  ibuprofen (ADVIL,MOTRIN) 600 MG tablet One tablet with food up to three times a day as needed for pain. 05/07/18   Darreld McleanKeeling, Wayne, MD  levothyroxine (SYNTHROID, LEVOTHROID) 75 MCG tablet TAKE 1 TABLET BY MOUTH ONCE DAILY BEFORE BREAKFAST 05/01/18   [provider]  lisinopril (PRINIVIL,ZESTRIL) 20 MG tablet  05/01/18   [provider]  omeprazole (PRILOSEC) 20 MG capsule  07/22/15   [provider]  predniSONE (STERAPRED UNI-PAK 21 TAB) 5 MG (21) TBPK tablet Take 6 pills first day; 5 pills second day; 4 pills third day; 3 pills fourth day; 2 pills next day and 1 pill last day. 12/11/18   Sanjuana Kava, MD  zolpidem (AMBIEN) 10 MG tablet Take 10 mg by mouth at bedtime as needed for sleep.    [provider]     Allergies:     Allergies  Allergen Reactions   Naprosyn [Naproxen]      Physical Exam:   Vitals  Blood pressure 91/80, pulse 79, temperature 99 F (37.2 C), temperature source Oral, resp. rate 16, height 5\' 7"  (1.702 m), weight 79.4 kg, SpO2 96 %.  1.  General: Appears in no acute distress  2. Psychiatric: Alert, oriented x3, intact insight and judgment  3. Neurologic: Cranial nerves II through XII grossly intact  4. HEENMT:  Atraumatic normocephalic, extraocular muscles are intact  5. Respiratory : Clear to auscultation bilaterally, no wheezing or crackles  6. Cardiovascular : S1-S2, regular, no murmur auscultated  7.  Gastrointestinal:  Abdomen is soft, nontender, no organomegaly  8. Skin:  Bruise noted on the forehead  9.Musculoskeletal:  No limitation of range of motion of left lower extremity, reduced sensation left foot. Left elbow-erythematous, warm to touch.  Mildly tender to palpation    Data Review:    CBC Recent Labs  Lab 03/25/19 2118  WBC 18.2*  HGB 12.6  HCT 39.7  PLT 207  MCV 96.8  MCH 30.7  MCHC 31.7  RDW 12.8   ------------------------------------------------------------------------------------------------------------------  Results for orders placed or performed during the hospital encounter of 03/25/19 (from the past 48 hour(s))  CBC     Status: Abnormal   Collection Time: 03/25/19  9:18 PM  Result Value Ref Range   WBC 18.2 (H) 4.0 - 10.5 K/uL   RBC 4.10 3.87 - 5.11 MIL/uL   Hemoglobin 12.6 12.0 - 15.0 g/dL   HCT 39.7 36.0 - 46.0 %   MCV 96.8 80.0 - 100.0 fL   MCH 30.7 26.0 - 34.0 pg   MCHC 31.7 30.0 - 36.0 g/dL   RDW 12.8 11.5 - 15.5 %   Platelets 207 150 - 400 K/uL   nRBC 0.0 0.0 - 0.2 %    Comment: Performed at [facility], [address] [place], [place] [address]  Comprehensive metabolic panel     Status: Abnormal   Collection Time: 03/25/19  9:18 PM  Result Value Ref Range   Sodium 133 (L) 135 - 145 mmol/L   Potassium 4.7 3.5 - 5.1 mmol/L   Chloride 101 98 - 111 mmol/L   CO2 19 (L) 22 - 32 mmol/L   Glucose, Bld 114 (H) 70 - 99 mg/dL   BUN 36 (H) 8 - 23 mg/dL   Creatinine, Ser 2.32 (H) 0.44 - 1.00 mg/dL   Calcium 8.8 (L) 8.9 - 10.3 mg/dL   Total Protein 7.1 6.5 - 8.1 g/dL   Albumin 3.7 3.5 - 5.0 g/dL   AST 58 (H) 15 - 41 U/L   ALT 18 0 - 44 U/L   Alkaline Phosphatase 101 38 - 126 U/L   Total Bilirubin 1.1 0.3 - 1.2 mg/dL   GFR calc non Af Amer 20 (L) >60 mL/min   GFR calc Af Amer 23 (L) >60 mL/min  Anion gap 13 5 - 15    Comment: Performed at [facility], [address], [place], [place] [address]    Chemistries  Recent Labs  Lab  03/25/19 2118  NA 133*  K 4.7  CL 101  CO2 19*  GLUCOSE 114*  BUN 36*  CREATININE 2.32*  CALCIUM 8.8*  AST 58*  ALT 18  ALKPHOS 101  BILITOT 1.1   ------------------------------------------------------------------------------------------------------------------  ------------------------------------------------------------------------------------------------------------------ GFR: Estimated Creatinine Clearance: 22.4 mL/min (A) (by C-G formula based on SCr of 2.32 mg/dL (H)). Liver Function Tests: Recent Labs  Lab 03/25/19 2118  AST 58*  ALT 18  ALKPHOS 101  BILITOT 1.1  PROT 7.1  ALBUMIN 3.7   No results for input(s): LIPASE, AMYLASE in the last 168 hours. No results for input(s): AMMONIA in the last 168 hours. Coagulation Profile: No results for input(s): INR, PROTIME in the last 168 hours. Cardiac Enzymes: No results for input(s): CKTOTAL, CKMB, CKMBINDEX, TROPONINI in the last 168 hours. BNP (last 3 results) No results for input(s): PROBNP in the last 8760 hours. HbA1C: No results for input(s): HGBA1C in the last 72 hours. CBG: No results for input(s): GLUCAP in the last 168 hours. Lipid Profile: No results for input(s): CHOL, HDL, LDLCALC, TRIG, CHOLHDL, LDLDIRECT in the last 72 hours. Thyroid Function Tests: No results for input(s): TSH, T4TOTAL, FREET4, T3FREE, THYROIDAB in the last 72 hours. Anemia Panel: No results for input(s): VITAMINB12, FOLATE, FERRITIN, TIBC, IRON, RETICCTPCT in the last 72 hours.  --------------------------------------------------------------------------------------------------------------- Urine analysis:    Component Value Date/Time   COLORURINE AMBER (A) 01/29/2018 0500   APPEARANCEUR HAZY (A) 01/29/2018 0500   LABSPEC 1.012 01/29/2018 0500   PHURINE 5.0 01/29/2018 0500   GLUCOSEU NEGATIVE 01/29/2018 0500   HGBUR SMALL (A) 01/29/2018 0500   BILIRUBINUR NEGATIVE 01/29/2018 0500   KETONESUR NEGATIVE 01/29/2018 0500    PROTEINUR 30 (A) 01/29/2018 0500   UROBILINOGEN 0.2 01/03/2009 1550   NITRITE NEGATIVE 01/29/2018 0500   LEUKOCYTESUR MODERATE (A) 01/29/2018 0500      Imaging Results:    Dg Elbow Complete Left  Result Date: 03/25/2019 CLINICAL DATA:  Recent fall unrelated to the elbow pain which has been chronic over the past week EXAM: LEFT ELBOW - COMPLETE 3+ VIEW COMPARISON:  04/22/2018 FINDINGS: There are changes consistent with prior fixation of proximal ulnar fracture. The elbow joint is significantly degenerated with some suggestion of subluxation of the humerus from the ulna on 1 of the straight arm views. Multiple dystrophic calcifications are again identified consistent with the prior trauma. The radial head is been surgically removed. IMPRESSION: Apparent subluxation of the humerus with respect to the articular surface of the ulna. Postsurgical changes in the proximal ulna and proximal radius are seen. This subluxation is seen on only 1 image. Correlation with the physical exam is recommended. Electronically Signed   By: Alcide Clever M.D.   On: 03/25/2019 22:56   Ct Head Wo Contrast  Result Date: 03/25/2019 CLINICAL DATA:  78 year old female with unexplained altered mental status. Nausea vomiting and diarrhea since this morning. EXAM: CT HEAD WITHOUT CONTRAST CT CERVICAL SPINE WITHOUT CONTRAST TECHNIQUE: Multidetector CT imaging of the head and cervical spine was performed following the standard protocol without intravenous contrast. Multiplanar CT image reconstructions of the cervical spine were also generated. COMPARISON:  Face CT 06/27/2016. FINDINGS: CT HEAD FINDINGS Brain: Chronic subarachnoid CSF along the cerebral hemispheres appears not significantly changed from that visible in 2017. No subdural space involvement suspected. No midline shift,  ventriculomegaly, mass effect, evidence of mass lesion, intracranial hemorrhage or evidence of cortically based acute infarction. Gray-white matter  differentiation is within normal limits throughout the brain. Vascular: Calcified atherosclerosis at the skull base. No suspicious intracranial vascular hyperdensity. Skull: No acute osseous abnormality identified. Sinuses/Orbits: Visualized paranasal sinuses and mastoids are clear. Other: No acute orbit or scalp soft tissue findings. CT CERVICAL SPINE FINDINGS Alignment: Mildly exaggerated cervical lordosis. Cervicothoracic junction alignment is within normal limits. Bilateral posterior element alignment is within normal limits. Skull base and vertebrae: Visualized skull base is intact. No atlanto-occipital dissociation. No acute osseous abnormality identified. Soft tissues and spinal canal: No prevertebral fluid or swelling. No visible canal hematoma. Bulky calcified cervical carotid atherosclerosis greater on the right. Disc levels: Mild for age cervical disc and endplate degeneration with mild if any associated spinal stenosis. Up to moderate cervical facet degeneration greater on the left. Upper chest: Mild T1 superior endplate deformity, otherwise the visible upper thoracic levels appear intact. Negative lung apices. IMPRESSION: 1. No acute intracranial abnormality. Negative non contrast CT appearance of the brain aside from chronic increased extra-axial CSF which may be related to generalized brain volume loss. 2. No acute traumatic injury identified in the cervical spine. Mild for age cervical spine degeneration. 3. Mild chronic appearing T1 superior endplate compression. Electronically Signed   By: Odessa Fleming M.D.   On: 03/25/2019 22:11   Ct Cervical Spine Wo Contrast  Result Date: 03/25/2019 CLINICAL DATA:  78 year old female with unexplained altered mental status. Nausea vomiting and diarrhea since this morning. EXAM: CT HEAD WITHOUT CONTRAST CT CERVICAL SPINE WITHOUT CONTRAST TECHNIQUE: Multidetector CT imaging of the head and cervical spine was performed following the standard protocol without  intravenous contrast. Multiplanar CT image reconstructions of the cervical spine were also generated. COMPARISON:  Face CT 06/27/2016. FINDINGS: CT HEAD FINDINGS Brain: Chronic subarachnoid CSF along the cerebral hemispheres appears not significantly changed from that visible in 2017. No subdural space involvement suspected. No midline shift, ventriculomegaly, mass effect, evidence of mass lesion, intracranial hemorrhage or evidence of cortically based acute infarction. Gray-white matter differentiation is within normal limits throughout the brain. Vascular: Calcified atherosclerosis at the skull base. No suspicious intracranial vascular hyperdensity. Skull: No acute osseous abnormality identified. Sinuses/Orbits: Visualized paranasal sinuses and mastoids are clear. Other: No acute orbit or scalp soft tissue findings. CT CERVICAL SPINE FINDINGS Alignment: Mildly exaggerated cervical lordosis. Cervicothoracic junction alignment is within normal limits. Bilateral posterior element alignment is within normal limits. Skull base and vertebrae: Visualized skull base is intact. No atlanto-occipital dissociation. No acute osseous abnormality identified. Soft tissues and spinal canal: No prevertebral fluid or swelling. No visible canal hematoma. Bulky calcified cervical carotid atherosclerosis greater on the right. Disc levels: Mild for age cervical disc and endplate degeneration with mild if any associated spinal stenosis. Up to moderate cervical facet degeneration greater on the left. Upper chest: Mild T1 superior endplate deformity, otherwise the visible upper thoracic levels appear intact. Negative lung apices. IMPRESSION: 1. No acute intracranial abnormality. Negative non contrast CT appearance of the brain aside from chronic increased extra-axial CSF which may be related to generalized brain volume loss. 2. No acute traumatic injury identified in the cervical spine. Mild for age cervical spine degeneration. 3. Mild  chronic appearing T1 superior endplate compression. Electronically Signed   By: Odessa Fleming M.D.   On: 03/25/2019 22:11      Assessment & Plan:    Active Problems:   Fall   1. Fall-likely from  numbness of left lower extremity.  Will obtain x-ray of the lumbar spine.  Patient recently had MRI of the lumbar spine in June 2020 which showed multilevel disc degenerative  Disease.  Continue Vicodin as needed.  Flexeril 3 times daily as needed.  2. Acute kidney injury on CKD stage III-patient baseline creatinine 1.5.  Today creatinine is 2.32, likely from ongoing diarrhea.  Start IV normal saline at 100 mL/h.  Follow BMP in a.m.  3. Diarrhea-patient had multiple episodes of watery stools, WBC is 18,000.  She has been afebrile.  Schultz stool for C. difficile PCR.  4. Hypothyroidism-continue Synthroid.  5. Hypertension-hold ACE inhibitor due to worsening renal function.  Blood pressure stable  6. Left humerus subluxation-patient had left elbow surgery in the past.  Will consult orthopedics for further recommendations.    DVT Prophylaxis-   Lovenox   AM Labs Ordered, also please review Full Orders  Family Communication: Admission, patients condition and plan of care including tests being ordered have been discussed with the patient and her daughter at bedside who indicate understanding and agree with the plan and Code Status.  Code Status: Full code  Admission status: Observation: Based on patients clinical presentation and evaluation of above clinical data, I have made determination that patient meets Inpatient criteria at this time.  Time spent in minutes : 60 minutes   Meredeth IdeGagan S Jarrah Babich M.D on 03/26/2019 at 5:22 AM

## 2019-03-27 DIAGNOSIS — N179 Acute kidney failure, unspecified: Secondary | ICD-10-CM

## 2019-03-27 DIAGNOSIS — R200 Anesthesia of skin: Secondary | ICD-10-CM | POA: Diagnosis not present

## 2019-03-27 DIAGNOSIS — I10 Essential (primary) hypertension: Secondary | ICD-10-CM

## 2019-03-27 DIAGNOSIS — K580 Irritable bowel syndrome with diarrhea: Secondary | ICD-10-CM | POA: Diagnosis not present

## 2019-03-27 DIAGNOSIS — M25522 Pain in left elbow: Secondary | ICD-10-CM | POA: Diagnosis not present

## 2019-03-27 DIAGNOSIS — W19XXXD Unspecified fall, subsequent encounter: Secondary | ICD-10-CM

## 2019-03-27 DIAGNOSIS — N183 Chronic kidney disease, stage 3 unspecified: Secondary | ICD-10-CM

## 2019-03-27 LAB — COMPREHENSIVE METABOLIC PANEL
ALT: 16 U/L (ref 0–44)
AST: 37 U/L (ref 15–41)
Albumin: 2.9 g/dL — ABNORMAL LOW (ref 3.5–5.0)
Alkaline Phosphatase: 65 U/L (ref 38–126)
Anion gap: 4 — ABNORMAL LOW (ref 5–15)
BUN: 26 mg/dL — ABNORMAL HIGH (ref 8–23)
CO2: 22 mmol/L (ref 22–32)
Calcium: 8.2 mg/dL — ABNORMAL LOW (ref 8.9–10.3)
Chloride: 112 mmol/L — ABNORMAL HIGH (ref 98–111)
Creatinine, Ser: 1.48 mg/dL — ABNORMAL HIGH (ref 0.44–1.00)
GFR calc Af Amer: 39 mL/min — ABNORMAL LOW (ref >60)
GFR calc non Af Amer: 34 mL/min — ABNORMAL LOW (ref >60)
Glucose, Bld: 102 mg/dL — ABNORMAL HIGH (ref 70–99)
Potassium: 4.5 mmol/L (ref 3.5–5.1)
Sodium: 138 mmol/L (ref 135–145)
Total Bilirubin: 0.4 mg/dL (ref 0.3–1.2)
Total Protein: 5.8 g/dL — ABNORMAL LOW (ref 6.5–8.1)

## 2019-03-27 LAB — CBC
HCT: 32.6 % — ABNORMAL LOW (ref 36.0–46.0)
Hemoglobin: 10.3 g/dL — ABNORMAL LOW (ref 12.0–15.0)
MCH: 31.5 pg (ref 26.0–34.0)
MCHC: 31.6 g/dL (ref 30.0–36.0)
MCV: 99.7 fL (ref 80.0–100.0)
Platelets: 160 10*3/uL (ref 150–400)
RBC: 3.27 MIL/uL — ABNORMAL LOW (ref 3.87–5.11)
RDW: 13 % (ref 11.5–15.5)
WBC: 6.1 10*3/uL (ref 4.0–10.5)
nRBC: 0 % (ref 0.0–0.2)

## 2019-03-27 MED ORDER — OMEPRAZOLE 20 MG PO CPDR: 20.0000 mg | DELAYED_RELEASE_CAPSULE | Freq: Every day | ORAL | Status: AC

## 2019-03-27 MED ORDER — ENOXAPARIN SODIUM 40 MG/0.4ML ~~LOC~~ SOLN: 40.0000 mg | SUBCUTANEOUS | Status: DC

## 2019-03-27 NOTE — Plan of Care (Signed)

## 2019-03-27 NOTE — Discharge Summary (Signed)
Physician Discharge Summary  Kim Schultz OVF:[id number] DOB: Dec 15, 1940 DOA: 03/25/2019  PCP: [facility] Ort [facility]  Admit date: 03/25/2019 Discharge date: 03/27/2019  Time spent: 35 minutes  Recommendations for Outpatient Follow-up:  Reassess blood pressure and adjust antihypertensive regimen as needed Repeat basic metabolic panel to evaluate lites renal function   Discharge Diagnoses:  Active Problems:   Fall   Irritable bowel syndrome with diarrhea   Acute renal failure superimposed on stage 3 chronic kidney disease (HCC)   Left elbow pain Essential hypertension Hypothyroidism   Discharge Condition: Stable and improved.  Discharged home with instruction to follow-up with PCP and with orthopedic service as an outpatient.  Diet recommendation: Heart healthy diet.  Filed Weights   03/25/19 2047 03/26/19 0941 03/27/19 0427  Weight: 79.4 kg 84.6 kg 86.1 kg    History of present illness:  As per H&P written by Dr. Sharl Ma on 03/26/2019. 78 y.o. female, with history of hypothyroidism, GERD, degenerative disc disease came to ED after patient fell at home.  Patient says that she was sitting on the commode for almost 8 hours due to diarrhea and then developed numbness of left foot.  Patient said that she was unable to bear weight on the left foot and fell.  She hit her forehead denied loss of consciousness.  In the ED, CT head was negative.  X-ray of the left elbow showed apparent subluxation of the humerus with respect to the articular surface of the ulna. Lab work showed acute kidney injury with creatinine 2.32.  Patient's previous creatinine was 1.51 in July 2019.  Hospital Course:  1-mechanical fall likely from numbness in her left lower extremity which appears to be associated with prolonged sitting position on the commode. -X-rays demonstrated no acute or normalities -Continue medication regimen to assist with multilevel disc degenerative changes as seen on MRI from June  2020. -Patient reports no significant pain or this capacity at this time. -Will discharge home with instruction to follow-up with her orthopedic surgeon as an outpatient.  2-acute kidney injury and chronic kidney disease stage III -Baseline creatinine 1.5 -Creatinine on admission 2.32-most likely secondary to GI losses and dehydration with prerenal azotemia. -After fluid resuscitation and holding nephrotoxic agents renal function down to baseline (1.4 at discharge). -Patient advised to keep herself well-hydrated -Will discontinue the use of NSAIDs. -Safe to resume patient's ACE inhibitor.  3-diarrhea -Patient with underlying history of IBS -No fever, no nausea, no vomiting, no abdominal pain. -No further episode of diarrhea since admission -Doubt infectious in origin -Most likely flare of IBS -Discharged home with the use of Bentyl as previously instructed -Continue outpatient follow-up with gastroenterologist. -Patient advised to maintain adequate hydration.  4-hypothyroidism -Continue Synthroid.  5-hypertension -Stable and well-controlled -Resume home antihypertensive regimen -Patient advised to follow heart healthy diet.  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 -Sling has been placed to assist with discomfort and the patient will follow-up with orthopedic service as an outpatient.  Procedures:  See below for x-ray reports.  Consultations:  Dr. Manus Gunning (orthopedic service on-call, curbside who has recommended outpatient follow-up and use the use of a sling to assist with comfort).  Discharge Exam: Vitals:   03/26/19 2116 03/27/19 0505  BP: (!) 141/59 138/64  Pulse: 83 81  Resp:  18  Temp: 98.4 F (36.9 C) 98 F (36.7 C)  SpO2: 100% 93%    General: Left forehead bruise from recent mechanical fall; no  headaches, no focal weakness.  Feeling back to baseline and ready to go home.   Denies chest pain, shortness of breath, nausea, vomiting or any further diarrhea. Cardiovascular: S1 and S2, no rubs, no gallops, no JVD. Respiratory: Clear to auscultation bilaterally, normal respiratory effort. Abdomen: Soft, nontender, nondistended, positive bowel sounds Extremities: No cyanosis or clubbing.  Left elbow with bruise, swelling and deformities on exam.  Patient reports some of these changes noted to be new.  Sling in place with overall improvement in pain.  Discharge Instructions   Discharge Instructions    Diet - low sodium heart healthy   Complete by: As directed    Discharge instructions   Complete by: As directed    Take medications as prescribed Maintain adequate hydration Follow heart healthy diet. Arrange follow-up with PCP in 2 weeks. Follow-up with orthopedic service as an outpatient.     Allergies as of 03/27/2019      Reactions   Naprosyn [naproxen]       Medication List    STOP taking these medications   ibuprofen 600 MG tablet Commonly known as: ADVIL     TAKE these medications   ALPRAZolam 0.5 MG tablet Commonly known as: XANAX Take 0.5 mg by mouth 3 (three) times daily.   cyclobenzaprine 10 MG tablet Commonly known as: FLEXERIL One tablet twice a day as needed for spasm   dicyclomine 10 MG capsule Commonly known as: Bentyl Take 1 capsule (10 mg total) by mouth 4 (four) times daily -  before meals and at bedtime.   Euthyrox 88 MCG tablet Generic drug: levothyroxine Take 88 mcg by mouth daily.   HYDROcodone-acetaminophen 7.5-325 MG tablet Commonly known as: Norco One tablet every four to six hours for pain.  Must last 30 days.   lisinopril 20 MG tablet Commonly known as: ZESTRIL   omeprazole 20 MG capsule Commonly known as: PRILOSEC Take 1 capsule (20 mg total) by mouth daily. What changed:   how much to take  how to take this  when to take this   zolpidem 10 MG tablet Commonly known as: AMBIEN Take 10 mg by mouth at  bedtime as needed for sleep.      Allergies  Allergen Reactions  . Naprosyn [Naproxen]    Follow-up Information    [facility]. Schedule an appointment as soon as possible for a visit in 2 week(s).   Contact information: [place]  [phone]           The results of significant diagnostics from this hospitalization (including imaging, microbiology, ancillary and laboratory) are listed below for reference.    Significant Diagnostic Studies: Dg Lumbar Spine 2-3 Views  Result Date: 03/26/2019 CLINICAL DATA:  Fall last night with left foot numbness and low back pain EXAM: LUMBAR SPINE - 2-3 VIEW COMPARISON:  None. FINDINGS: Degenerative facet spurring with dextroscoliosis. Multilevel lumbar disc narrowing with mild ridging. Grade 1 anterolisthesis at L5-S1. Generalized osteopenic appearance. No evident acute fracture. IMPRESSION: 1. No acute finding. 2. Lumbar spine degeneration and scoliosis. Electronically Signed   By: Monte Fantasia M.D.   On: 03/26/2019 07:18   Dg Elbow Complete Left  Result Date: 03/25/2019 CLINICAL DATA:  Recent fall unrelated to the elbow pain which has been chronic over the past week EXAM: LEFT ELBOW - COMPLETE 3+ VIEW COMPARISON:  04/22/2018 FINDINGS: There are changes consistent with prior fixation of proximal ulnar fracture. The elbow joint is significantly degenerated with some suggestion  of subluxation of the humerus from the ulna on 1 of the straight arm views. Multiple dystrophic calcifications are again identified consistent with the prior trauma. The radial head is been surgically removed. IMPRESSION: Apparent subluxation of the humerus with respect to the articular surface of the ulna. Postsurgical changes in the proximal ulna and proximal radius are seen. This subluxation is seen on only 1 image. Correlation with the physical exam is recommended. Electronically Signed   By: Alcide CleverMark  Lukens M.D.   On: 03/25/2019 22:56    Ct Head Wo Contrast  Result Date: 03/25/2019 CLINICAL DATA:  78 year old female with unexplained altered mental status. Nausea vomiting and diarrhea since this morning. EXAM: CT HEAD WITHOUT CONTRAST CT CERVICAL SPINE WITHOUT CONTRAST TECHNIQUE: Multidetector CT imaging of the head and cervical spine was performed following the standard protocol without intravenous contrast. Multiplanar CT image reconstructions of the cervical spine were also generated. COMPARISON:  Face CT 06/27/2016. FINDINGS: CT HEAD FINDINGS Brain: Chronic subarachnoid CSF along the cerebral hemispheres appears not significantly changed from that visible in 2017. No subdural space involvement suspected. No midline shift, ventriculomegaly, mass effect, evidence of mass lesion, intracranial hemorrhage or evidence of cortically based acute infarction. Gray-white matter differentiation is within normal limits throughout the brain. Vascular: Calcified atherosclerosis at the skull base. No suspicious intracranial vascular hyperdensity. Skull: No acute osseous abnormality identified. Sinuses/Orbits: Visualized paranasal sinuses and mastoids are clear. Other: No acute orbit or scalp soft tissue findings. CT CERVICAL SPINE FINDINGS Alignment: Mildly exaggerated cervical lordosis. Cervicothoracic junction alignment is within normal limits. Bilateral posterior element alignment is within normal limits. Skull base and vertebrae: Visualized skull base is intact. No atlanto-occipital dissociation. No acute osseous abnormality identified. Soft tissues and spinal canal: No prevertebral fluid or swelling. No visible canal hematoma. Bulky calcified cervical carotid atherosclerosis greater on the right. Disc levels: Mild for age cervical disc and endplate degeneration with mild if any associated spinal stenosis. Up to moderate cervical facet degeneration greater on the left. Upper chest: Mild T1 superior endplate deformity, otherwise the visible upper  thoracic levels appear intact. Negative lung apices. IMPRESSION: 1. No acute intracranial abnormality. Negative non contrast CT appearance of the brain aside from chronic increased extra-axial CSF which may be related to generalized brain volume loss. 2. No acute traumatic injury identified in the cervical spine. Mild for age cervical spine degeneration. 3. Mild chronic appearing T1 superior endplate compression. Electronically Signed   By: Odessa FlemingH  Hall M.D.   On: 03/25/2019 22:11   Ct Cervical Spine Wo Contrast  Result Date: 03/25/2019 CLINICAL DATA:  78 year old female with unexplained altered mental status. Nausea vomiting and diarrhea since this morning. EXAM: CT HEAD WITHOUT CONTRAST CT CERVICAL SPINE WITHOUT CONTRAST TECHNIQUE: Multidetector CT imaging of the head and cervical spine was performed following the standard protocol without intravenous contrast. Multiplanar CT image reconstructions of the cervical spine were also generated. COMPARISON:  Face CT 06/27/2016. FINDINGS: CT HEAD FINDINGS Brain: Chronic subarachnoid CSF along the cerebral hemispheres appears not significantly changed from that visible in 2017. No subdural space involvement suspected. No midline shift, ventriculomegaly, mass effect, evidence of mass lesion, intracranial hemorrhage or evidence of cortically based acute infarction. Gray-white matter differentiation is within normal limits throughout the brain. Vascular: Calcified atherosclerosis at the skull base. No suspicious intracranial vascular hyperdensity. Skull: No acute osseous abnormality identified. Sinuses/Orbits: Visualized paranasal sinuses and mastoids are clear. Other: No acute orbit or scalp soft tissue findings. CT CERVICAL SPINE FINDINGS Alignment: Mildly exaggerated cervical lordosis.  Cervicothoracic junction alignment is within normal limits. Bilateral posterior element alignment is within normal limits. Skull base and vertebrae: Visualized skull base is intact. No  atlanto-occipital dissociation. No acute osseous abnormality identified. Soft tissues and spinal canal: No prevertebral fluid or swelling. No visible canal hematoma. Bulky calcified cervical carotid atherosclerosis greater on the right. Disc levels: Mild for age cervical disc and endplate degeneration with mild if any associated spinal stenosis. Up to moderate cervical facet degeneration greater on the left. Upper chest: Mild T1 superior endplate deformity, otherwise the visible upper thoracic levels appear intact. Negative lung apices. IMPRESSION: 1. No acute intracranial abnormality. Negative non contrast CT appearance of the brain aside from chronic increased extra-axial CSF which may be related to generalized brain volume loss. 2. No acute traumatic injury identified in the cervical spine. Mild for age cervical spine degeneration. 3. Mild chronic appearing T1 superior endplate compression. Electronically Signed   By: Odessa FlemingH  Hall M.D.   On: 03/25/2019 22:11   Ct Elbow Left Wo Contrast  Result Date: 03/26/2019 CLINICAL DATA:  Bruising of the left elbow secondary to a fall last night. Previous open reduction and internal fixation of proximal ulna fracture. EXAM: CT OF THE UPPER LEFT EXTREMITY WITHOUT CONTRAST TECHNIQUE: Multidetector CT imaging of the upper left extremity was performed according to the standard protocol. COMPARISON:  Radiographs dated 03/25/2019 and 04/22/2018 and 01/04/2009 FINDINGS: Bones/Joint/Cartilage There is a chronic deformity of the distal humerus and of the proximal ulna. The radial head has been resected in was not present on the study of 2010. Capitellum appears chronically eroded. 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 Comparing the projection of the scout image for this exam with radiographs obtained on 04/22/2018, alignment and position of the bones appears essentially unchanged. No acute fractures. Muscles and Tendons and soft tissues Detail is  obscured by metal artifact and image degradation due to necessary positioning of the patient. No visible acute abnormality. IMPRESSION: 1. Chronic deformity of the distal humerus and proximal ulna. 2. The radial head has been resected and had been removed in 2010. 3. 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 4. No acute fractures.  I do not think there are acute dislocations. Electronically Signed   By: Francene BoyersJames  Maxwell M.D.   On: 03/26/2019 17:56    Microbiology: Recent Results (from the past 240 hour(s))  SARS CORONAVIRUS 2 (TAT 6-24 HRS) Nasopharyngeal Nasopharyngeal Swab     Status: None   Collection Time: 03/26/19  7:16 AM   Specimen: Nasopharyngeal Swab  Result Value Ref Range Status   SARS Coronavirus 2 NEGATIVE NEGATIVE Final    Comment: (NOTE) SARS-CoV-2 target nucleic acids are NOT DETECTED. The SARS-CoV-2 RNA is generally detectable in upper and lower respiratory specimens during the acute phase of infection. Negative results do not preclude SARS-CoV-2 infection, do not rule out co-infections with other pathogens, and should not be used as the sole basis for treatment or other patient management decisions. Negative results must be combined with clinical observations, patient history, and epidemiological information. The expected result is Negative. Fact Sheet for Patients: HairSlick.nohttps://www.fda.gov/media/138098/download Fact Sheet for Healthcare Providers: quierodirigir.comhttps://www.fda.gov/media/138095/download This test is not yet approved or cleared by the [place] FDA and  has been authorized for detection and/or diagnosis of SARS-CoV-2 by FDA under an Emergency Use Authorization (EUA). This EUA will remain  in effect (meaning this test can be used) for the duration of the COVID-19 declaration under Section 56 4(b)(1) of the Act,  21 U.S.C. section 360bbb-3(b)(1), unless the authorization is terminated or revoked sooner. Performed at [facility]  Lab, [address], [place], [place] [address]      Labs: Basic Metabolic Panel: Recent Labs  Lab 03/25/19 2118 03/27/19 0512  NA 133* 138  K 4.7 4.5  CL 101 112*  CO2 19* 22  GLUCOSE 114* 102*  BUN 36* 26*  CREATININE 2.32* 1.48*  CALCIUM 8.8* 8.2*   Liver Function Tests: Recent Labs  Lab 03/25/19 2118 03/27/19 0512  AST 58* 37  ALT 18 16  ALKPHOS 101 65  BILITOT 1.1 0.4  PROT 7.1 5.8*  ALBUMIN 3.7 2.9*   CBC: Recent Labs  Lab 03/25/19 2118 03/27/19 0512  WBC 18.2* 6.1  HGB 12.6 10.3*  HCT 39.7 32.6*  MCV 96.8 99.7  PLT 207 160   Signed:  Vassie Loll MD.  [facility] 03/27/2019, 2:15 PM

## 2019-04-21 ENCOUNTER — Ambulatory Visit (INDEPENDENT_AMBULATORY_CARE_PROVIDER_SITE_OTHER): Payer: Medicare Other | Admitting: Orthopaedic Surgery

## 2019-04-21 ENCOUNTER — Encounter: Payer: Self-pay | Admitting: Orthopaedic Surgery

## 2019-04-21 ENCOUNTER — Other Ambulatory Visit: Payer: Self-pay

## 2019-04-21 VITALS — BP 171/65 | HR 83 | Ht 67.0 in | Wt 184.0 lb

## 2019-04-21 DIAGNOSIS — M5441 Lumbago with sciatica, right side: Secondary | ICD-10-CM | POA: Diagnosis not present

## 2019-04-21 DIAGNOSIS — G8929 Other chronic pain: Secondary | ICD-10-CM

## 2019-04-21 MED ORDER — CYCLOBENZAPRINE HCL 10 MG PO TABS: ORAL_TABLET | ORAL | 2 refills | Status: DC

## 2019-04-21 MED ORDER — HYDROCODONE-ACETAMINOPHEN 7.5-325 MG PO TABS: ORAL_TABLET | ORAL | 0 refills | Status: DC

## 2019-04-21 NOTE — Progress Notes (Signed)
Patient Kim Schultz, female DOB:[birth year], 78 y.o. YTK:[id number]  Chief Complaint  Patient presents with  . Back Pain    HPI  Kim Schultz is a 78 y.o. female who has lower back pain.  She has good and bad days. She has had more good days this past week. She has no numbness, no new trauma.  She is active and taking her medicine.   Body mass index is 28.82 kg/m.  ROS  Review of Systems  Constitutional: Positive for activity change.       Patient does not have Diabetes Mellitus. Patient does not have hypertension. Patient does not have COPD or shortness of breath. Patient does not have BMI > 35. Patient does not have current smoking history.  HENT: Negative for congestion.   Respiratory: Negative for cough and shortness of breath.   Cardiovascular: Negative for chest pain.  Endocrine: Positive for cold intolerance.  Musculoskeletal: Positive for arthralgias, back pain, gait problem, joint swelling and myalgias.  Allergic/Immunologic: Positive for environmental allergies.  Psychiatric/Behavioral: The patient is nervous/anxious.   All other systems reviewed and are negative.   All other systems reviewed and are negative.  The following is a summary of the past history medically, past history surgically, known current medicines, social history and family history.  This information is gathered electronically by the computer from prior information and documentation.  I review this each visit and have found including this information at this point in the chart is beneficial and informative.    Past Medical History:  Diagnosis Date  . Arthritis   . GERD (gastroesophageal reflux disease)   . Hypertension   . Thyroid disease     Past Surgical History:  Procedure Laterality Date  . ABDOMINAL HYSTERECTOMY    . ELBOW SURGERY Left   . TONSILLECTOMY      Family History  Family history unknown: Yes    Social History Social History   Tobacco Use  . Smoking  status: Never Smoker  . Smokeless tobacco: Never Used  Substance Use Topics  . Alcohol use: No  . Drug use: No    Allergies  Allergen Reactions  . Naprosyn [Naproxen]     Current Outpatient Medications  Medication Sig Dispense Refill  . ALPRAZolam (XANAX) 0.5 MG tablet Take 0.5 mg by mouth 3 (three) times daily.    . cyclobenzaprine (FLEXERIL) 10 MG tablet One tablet twice a day as needed for spasm 60 tablet 2  . dicyclomine (BENTYL) 10 MG capsule Take 1 capsule (10 mg total) by mouth 4 (four) times daily -  before meals and at bedtime. 90 capsule 3  . EUTHYROX 88 MCG tablet Take 88 mcg by mouth daily.    Marland Kitchen HYDROcodone-acetaminophen (NORCO) 7.5-325 MG tablet One tablet every four to six hours for pain.  Must last 30 days. 120 tablet 0  . lisinopril (PRINIVIL,ZESTRIL) 20 MG tablet     . omeprazole (PRILOSEC) 20 MG capsule Take 1 capsule (20 mg total) by mouth daily.    Marland Kitchen zolpidem (AMBIEN) 10 MG tablet Take 10 mg by mouth at bedtime as needed for sleep.     No current facility-administered medications for this visit.      Physical Exam  Blood pressure (!) 171/65, pulse 83, height 5\' 7"  (1.702 m), weight 184 lb (83.5 kg).  Constitutional: overall normal hygiene, normal nutrition, well developed, normal grooming, normal body habitus. Assistive device:none  Musculoskeletal: gait and station Limp none, muscle tone and strength are normal, no  tremors or atrophy is present.  .  Neurological: coordination overall normal.  Deep tendon reflex/nerve stretch intact.  Sensation normal.  Cranial nerves II-XII intact.   Skin:   Normal overall no scars, lesions, ulcers or rashes. No psoriasis.  Psychiatric: Alert and oriented x 3.  Recent memory intact, remote memory unclear.  Normal mood and affect. Well groomed.  Good eye contact.  Cardiovascular: overall no swelling, no varicosities, no edema bilaterally, normal temperatures of the legs and arms, no clubbing, cyanosis and good capillary  refill.  Lymphatic: palpation is normal.  Spine/Pelvis examination:  Inspection:  Overall, sacoiliac joint benign and hips nontender; without crepitus or defects.   Thoracic spine inspection: Alignment normal without kyphosis present   Lumbar spine inspection:  Alignment  with normal lumbar lordosis, without scoliosis apparent.   Thoracic spine palpation:  without tenderness of spinal processes   Lumbar spine palpation: without tenderness of lumbar area; without tightness of lumbar muscles    Range of Motion:   Lumbar flexion, forward flexion is normal without pain or tenderness    Lumbar extension is full without pain or tenderness   Left lateral bend is normal without pain or tenderness   Right lateral bend is normal without pain or tenderness   Straight leg raising is normal  Strength & tone: normal   Stability overall normal stability  All other systems reviewed and are negative   The patient has been educated about the nature of the problem(s) and counseled on treatment options.  The patient appeared to understand what I have discussed and is in agreement with it. Encounter Diagnosis  Name Primary?  . Chronic right-sided low back pain with right-sided sciatica Yes    PLAN Call if any problems.  Precautions discussed.  Continue current medications.   Return to clinic 3 months   I have reviewed the [facility] Controlled Substance Reporting System web site prior to prescribing narcotic medicine for this patient.   Electronically Signed Darreld Mclean, MD 10/13/202011:16 AM

## 2019-04-22 ENCOUNTER — Ambulatory Visit: Payer: Medicare Other | Admitting: Orthopaedic Surgery

## 2019-06-16 ENCOUNTER — Telehealth: Payer: Self-pay | Admitting: Orthopaedic Surgery

## 2019-06-16 MED ORDER — HYDROCODONE-ACETAMINOPHEN 7.5-325 MG PO TABS: ORAL_TABLET | ORAL | 0 refills | Status: DC

## 2019-06-16 NOTE — Telephone Encounter (Signed)
Refill on Hydrocodone/Acetaminophen 7.5-325  Mgs.  Qty  120       Sig: One tablet every four to six hours for pain. Must last 30 days.     Patient uses [employer]

## 2019-07-21 ENCOUNTER — Other Ambulatory Visit: Payer: Self-pay

## 2019-07-21 ENCOUNTER — Encounter: Payer: Self-pay | Admitting: Orthopaedic Surgery

## 2019-07-21 ENCOUNTER — Ambulatory Visit (INDEPENDENT_AMBULATORY_CARE_PROVIDER_SITE_OTHER): Payer: Medicare Other | Admitting: Orthopaedic Surgery

## 2019-07-21 VITALS — BP 139/89 | HR 88 | Temp 97.1°F | Ht 67.0 in | Wt 184.0 lb

## 2019-07-21 DIAGNOSIS — G8929 Other chronic pain: Secondary | ICD-10-CM

## 2019-07-21 DIAGNOSIS — G894 Chronic pain syndrome: Secondary | ICD-10-CM

## 2019-07-21 DIAGNOSIS — M5441 Lumbago with sciatica, right side: Secondary | ICD-10-CM | POA: Diagnosis not present

## 2019-07-21 MED ORDER — HYDROCODONE-ACETAMINOPHEN 7.5-325 MG PO TABS: ORAL_TABLET | ORAL | 0 refills | Status: DC

## 2019-07-21 MED ORDER — CYCLOBENZAPRINE HCL 10 MG PO TABS: ORAL_TABLET | ORAL | 2 refills | Status: DC

## 2019-07-21 NOTE — Progress Notes (Signed)
She has increased pain in the left elbow secondary to the weather.  She has had problems recently when she saw her family doctor. She was told they will stop her Xanax after over 25 years on them.  They refilled it for one more time.  She is very upset over this.    We talked here in the office for 30 minutes going over her home situation and how things were doing for her. She has considerable stress and worries.  I talked about many of them and suggestions were given.  I will refill her medicines.    Return in three months.  Call if any problem.  Precautions discussed.   Electronically Signed Darreld Mclean, MD 1/12/202110:07 AM

## 2019-10-20 ENCOUNTER — Other Ambulatory Visit: Payer: Self-pay

## 2019-10-20 ENCOUNTER — Encounter: Payer: Self-pay | Admitting: Orthopaedic Surgery

## 2019-10-20 ENCOUNTER — Ambulatory Visit (INDEPENDENT_AMBULATORY_CARE_PROVIDER_SITE_OTHER): Payer: Medicare Other | Admitting: Orthopaedic Surgery

## 2019-10-20 VITALS — BP 158/84 | HR 101 | Ht 67.0 in | Wt 195.0 lb

## 2019-10-20 DIAGNOSIS — G894 Chronic pain syndrome: Secondary | ICD-10-CM

## 2019-10-20 DIAGNOSIS — M25522 Pain in left elbow: Secondary | ICD-10-CM | POA: Diagnosis not present

## 2019-10-20 DIAGNOSIS — G8929 Other chronic pain: Secondary | ICD-10-CM

## 2019-10-20 DIAGNOSIS — M25562 Pain in left knee: Secondary | ICD-10-CM

## 2019-10-20 MED ORDER — HYDROCODONE-ACETAMINOPHEN 5-325 MG PO TABS: ORAL_TABLET | ORAL | 0 refills | Status: DC

## 2019-10-20 NOTE — Progress Notes (Addendum)
Patient Kim Schultz, female DOB:June 15, 1941, 79 y.o. [id number]  Chief Complaint  Patient presents with  . Elbow Pain    left    HPI  Kim Schultz is a 79 y.o. female who has chronic left elbow pain.  She hurt her left knee in August of last year while an inpatient in the hospital.  X-rays were negative. She continues to have pain of the left knee with swelling and popping but no giving way, no redness.  Her elbow is chronically painful.  She has no new trauma.   Body mass index is 30.54 kg/m.  ROS  Review of Systems  Constitutional: Positive for activity change.       Patient does not have Diabetes Mellitus. Patient does not have hypertension. Patient does not have COPD or shortness of breath. Patient does not have BMI > 35. Patient does not have current smoking history.  HENT: Negative for congestion.   Respiratory: Negative for cough and shortness of breath.   Cardiovascular: Negative for chest pain.  Endocrine: Positive for cold intolerance.  Musculoskeletal: Positive for arthralgias, back pain, gait problem, joint swelling and myalgias.  Allergic/Immunologic: Positive for environmental allergies.  Psychiatric/Behavioral: The patient is nervous/anxious.   All other systems reviewed and are negative.   All other systems reviewed and are negative.  The following is a summary of the past history medically, past history surgically, known current medicines, social history and family history.  This information is gathered electronically by the computer from prior information and documentation.  I review this each visit and have found including this information at this point in the chart is beneficial and informative.    Past Medical History:  Diagnosis Date  . Arthritis   . GERD (gastroesophageal reflux disease)   . Hypertension   . Thyroid disease     Past Surgical History:  Procedure Laterality Date  . ABDOMINAL HYSTERECTOMY    . ELBOW SURGERY Left   .  TONSILLECTOMY      Family History  Family history unknown: Yes    Social History Social History   Tobacco Use  . Smoking status: Never Smoker  . Smokeless tobacco: Never Used  Substance Use Topics  . Alcohol use: No  . Drug use: No    Allergies  Allergen Reactions  . Naprosyn [Naproxen]     Current Outpatient Medications  Medication Sig Dispense Refill  . ALPRAZolam (XANAX) 0.5 MG tablet Take 0.5 mg by mouth 3 (three) times daily.    . cyclobenzaprine (FLEXERIL) 10 MG tablet One tablet twice a day as needed for spasm 60 tablet 2  . dicyclomine (BENTYL) 10 MG capsule Take 1 capsule (10 mg total) by mouth 4 (four) times daily -  before meals and at bedtime. 90 capsule 3  . levothyroxine (SYNTHROID) 75 MCG tablet     . lisinopril (PRINIVIL,ZESTRIL) 20 MG tablet     . omeprazole (PRILOSEC) 20 MG capsule Take 1 capsule (20 mg total) by mouth daily.    Marland Kitchen zolpidem (AMBIEN) 10 MG tablet Take 10 mg by mouth at bedtime as needed for sleep.    Marland Kitchen HYDROcodone-acetaminophen (NORCO/VICODIN) 5-325 MG tablet One tablet by mouth every six hours as needed for pain. 120 tablet 0   No current facility-administered medications for this visit.     Physical Exam  Blood pressure (!) 158/84, pulse (!) 101, height 5\' 7"  (1.702 m), weight 195 lb (88.5 kg).  Constitutional: overall normal hygiene, normal nutrition, well developed, normal grooming,  normal body habitus. Assistive device:none  Musculoskeletal: gait and station Limp left, muscle tone and strength are normal, no tremors or atrophy is present.  .  Neurological: coordination overall normal.  Deep tendon reflex/nerve stretch intact.  Sensation normal.  Cranial nerves II-XII intact.   Skin:   Normal overall no scars, lesions, ulcers or rashes. No psoriasis.  Psychiatric: Alert and oriented x 3.  Recent memory intact, remote memory unclear.  Normal mood and affect. Well groomed.  Good eye contact.  Cardiovascular: overall no swelling,  no varicosities, no edema bilaterally, normal temperatures of the legs and arms, no clubbing, cyanosis and good capillary refill.  Lymphatic: palpation is normal.  Left knee has crepitus, some effusion, ROM 0 to 105, limp left, stable.  NV intact.  Left elbow is tender with limited motion.   All other systems reviewed and are negative   The patient has been educated about the nature of the problem(s) and counseled on treatment options.  The patient appeared to understand what I have discussed and is in agreement with it.  Encounter Diagnoses  Name Primary?  . Chronic pain of left knee Yes  . Chronic pain syndrome   . Elbow pain, chronic, left     PROCEDURE NOTE:  The patient requests injections of the left knee , verbal consent was obtained.  The left knee was prepped appropriately after time out was performed.   Sterile technique was observed and injection of 1 cc of Depo-Medrol 40 mg with several cc's of plain xylocaine. Anesthesia was provided by ethyl chloride and a 20-gauge needle was used to inject the knee area. The injection was tolerated well.  A band aid dressing was applied.  The patient was advised to apply ice later today and tomorrow to the injection sight as needed.  PLAN Call if any problems.  Precautions discussed.  Continue current medications.   Return to clinic 3 months   I have reviewed the [facility] Controlled Substance Reporting System web site prior to prescribing narcotic medicine for this patient.   Electronically Signed Darreld Mclean, MD 4/13/202110:42 AM

## 2019-11-19 ENCOUNTER — Other Ambulatory Visit: Payer: Self-pay | Admitting: Orthopaedic Surgery

## 2020-01-19 ENCOUNTER — Ambulatory Visit: Payer: Medicare Other | Admitting: Orthopaedic Surgery

## 2020-01-21 ENCOUNTER — Other Ambulatory Visit: Payer: Self-pay

## 2020-01-21 ENCOUNTER — Ambulatory Visit (INDEPENDENT_AMBULATORY_CARE_PROVIDER_SITE_OTHER): Payer: Medicare Other | Admitting: Orthopaedic Surgery

## 2020-01-21 ENCOUNTER — Encounter: Payer: Self-pay | Admitting: Orthopaedic Surgery

## 2020-01-21 VITALS — Ht 67.0 in | Wt 191.0 lb

## 2020-01-21 DIAGNOSIS — G8929 Other chronic pain: Secondary | ICD-10-CM

## 2020-01-21 DIAGNOSIS — G894 Chronic pain syndrome: Secondary | ICD-10-CM | POA: Diagnosis not present

## 2020-01-21 DIAGNOSIS — M25562 Pain in left knee: Secondary | ICD-10-CM

## 2020-01-21 DIAGNOSIS — M25522 Pain in left elbow: Secondary | ICD-10-CM | POA: Diagnosis not present

## 2020-01-21 MED ORDER — CYCLOBENZAPRINE HCL 10 MG PO TABS: ORAL_TABLET | ORAL | 2 refills | Status: DC

## 2020-01-21 MED ORDER — HYDROCODONE-ACETAMINOPHEN 5-325 MG PO TABS: ORAL_TABLET | ORAL | 0 refills | Status: DC

## 2020-01-21 NOTE — Progress Notes (Signed)
Patient Kim Schultz, female DOB:1940-11-23, 79 y.o. [id number]  Chief Complaint  Patient presents with  . Knee Pain  . Elbow Pain    HPI  Kim Schultz is a 79 y.o. female who has chronic pain syndrome, left elbow pain, left knee pain and lower back pain.  She passed out about a month ago and hit her face.  She did not seek any medical care.  She did not go to the ER.  I have suggested she see her primary care and she refuses.  Her left elbow is tender and has decreased motion which is chronic.  Her knee is not that tender today and her back pain comes and goes.  She has no weakness.   Body mass index is 29.91 kg/m.  ROS  Review of Systems  All other systems reviewed and are negative.  The following is a summary of the past history medically, past history surgically, known current medicines, social history and family history.  This information is gathered electronically by the computer from prior information and documentation.  I review this each visit and have found including this information at this point in the chart is beneficial and informative.    Past Medical History:  Diagnosis Date  . Arthritis   . GERD (gastroesophageal reflux disease)   . Hypertension   . Thyroid disease     Past Surgical History:  Procedure Laterality Date  . ABDOMINAL HYSTERECTOMY    . ELBOW SURGERY Left   . TONSILLECTOMY      Family History  Family history unknown: Yes    Social History Social History   Tobacco Use  . Smoking status: Never Smoker  . Smokeless tobacco: Never Used  Substance Use Topics  . Alcohol use: No  . Drug use: No    Allergies  Allergen Reactions  . Naprosyn [Naproxen]     Current Outpatient Medications  Medication Sig Dispense Refill  . ALPRAZolam (XANAX) 0.5 MG tablet Take 0.5 mg by mouth 3 (three) times daily.    . cyclobenzaprine (FLEXERIL) 10 MG tablet One tablet twice a day as needed for spasm 60 tablet 2  . dicyclomine (BENTYL) 10  MG capsule TAKE ONE CAPSULE BY MOUTH 4 TIMES DAILY-BEFORE MEALS AND AT BEDTIME. 90 capsule 0  . HYDROcodone-acetaminophen (NORCO/VICODIN) 5-325 MG tablet One tablet by mouth every six hours as needed for pain. 120 tablet 0  . levothyroxine (SYNTHROID) 75 MCG tablet     . lisinopril (PRINIVIL,ZESTRIL) 20 MG tablet     . omeprazole (PRILOSEC) 20 MG capsule Take 1 capsule (20 mg total) by mouth daily.    Marland Kitchen zolpidem (AMBIEN) 10 MG tablet Take 10 mg by mouth at bedtime as needed for sleep.     No current facility-administered medications for this visit.     Physical Exam  Height 5\' 7"  (1.702 m), weight 191 lb (86.6 kg).  Constitutional: overall normal hygiene, normal nutrition, well developed, normal grooming, normal body habitus. Assistive device:none  Musculoskeletal: gait and station Limp none, muscle tone and strength are normal, no tremors or atrophy is present.  .  Neurological: coordination overall normal.  Deep tendon reflex/nerve stretch intact.  Sensation normal.  Cranial nerves II-XII intact.   Skin:   Normal overall no scars, lesions, ulcers or rashes. No psoriasis.  Psychiatric: Alert and oriented x 3.  Recent memory intact, remote memory unclear.  Normal mood and affect. Well groomed.  Good eye contact.  Cardiovascular: overall no swelling, no varicosities, no  edema bilaterally, normal temperatures of the legs and arms, no clubbing, cyanosis and good capillary refill.  Lymphatic: palpation is normal.  All other systems reviewed and are negative   The patient has been educated about the nature of the problem(s) and counseled on treatment options.  The patient appeared to understand what I have discussed and is in agreement with it.  Encounter Diagnoses  Name Primary?  . Chronic pain syndrome Yes  . Elbow pain, chronic, left   . Chronic pain of left knee   I have strongly urged her to see family doctor or go to ER. She should not be passing out.  PLAN Call if any  problems.  Precautions discussed.  Continue current medications.   Return to clinic 3 months   I have reviewed the [facility] Controlled Substance Reporting System web site prior to prescribing narcotic medicine for this patient.   Electronically Signed Darreld Mclean, MD 7/15/202110:54 AM

## 2020-04-21 ENCOUNTER — Encounter: Payer: Self-pay | Admitting: Orthopaedic Surgery

## 2020-04-21 ENCOUNTER — Ambulatory Visit (INDEPENDENT_AMBULATORY_CARE_PROVIDER_SITE_OTHER): Payer: Medicare Other | Admitting: Orthopaedic Surgery

## 2020-04-21 ENCOUNTER — Other Ambulatory Visit: Payer: Self-pay

## 2020-04-21 VITALS — BP 160/98 | HR 96 | Ht 67.0 in | Wt 189.0 lb

## 2020-04-21 DIAGNOSIS — G894 Chronic pain syndrome: Secondary | ICD-10-CM | POA: Diagnosis not present

## 2020-04-21 DIAGNOSIS — G8929 Other chronic pain: Secondary | ICD-10-CM | POA: Diagnosis not present

## 2020-04-21 DIAGNOSIS — M25522 Pain in left elbow: Secondary | ICD-10-CM | POA: Diagnosis not present

## 2020-04-21 DIAGNOSIS — M5441 Lumbago with sciatica, right side: Secondary | ICD-10-CM | POA: Diagnosis not present

## 2020-04-21 MED ORDER — CYCLOBENZAPRINE HCL 10 MG PO TABS: ORAL_TABLET | ORAL | 2 refills | Status: DC

## 2020-04-21 MED ORDER — HYDROCODONE-ACETAMINOPHEN 5-325 MG PO TABS: ORAL_TABLET | ORAL | 0 refills | Status: DC

## 2020-04-21 NOTE — Progress Notes (Signed)
Patient Kim Schultz, female DOB:09-28-1940, 79 y.o. [id number]  Chief Complaint  Patient presents with   Knee Pain   Elbow Pain    HPI  Kim Schultz is a 79 y.o. female who has chronic back and left elbow pain.  She has spasm at times in the back.  The medicine helps. She does her exercises.  She has good and bad days. She has no new trauma, no weakness.     Body mass index is 29.6 kg/m.  ROS  Review of Systems  Constitutional: Positive for activity change.       Patient does not have Diabetes Mellitus. Patient does not have hypertension. Patient does not have COPD or shortness of breath. Patient does not have BMI > 35. Patient does not have current smoking history.  HENT: Negative for congestion.   Respiratory: Negative for cough and shortness of breath.   Cardiovascular: Negative for chest pain.  Endocrine: Positive for cold intolerance.  Musculoskeletal: Positive for arthralgias, back pain, gait problem, joint swelling and myalgias.  Allergic/Immunologic: Positive for environmental allergies.  Psychiatric/Behavioral: The patient is nervous/anxious.   All other systems reviewed and are negative.   All other systems reviewed and are negative.  The following is a summary of the past history medically, past history surgically, known current medicines, social history and family history.  This information is gathered electronically by the computer from prior information and documentation.  I review this each visit and have found including this information at this point in the chart is beneficial and informative.    Past Medical History:  Diagnosis Date   Arthritis    GERD (gastroesophageal reflux disease)    Hypertension    Thyroid disease     Past Surgical History:  Procedure Laterality Date   ABDOMINAL HYSTERECTOMY     ELBOW SURGERY Left    TONSILLECTOMY      Family History  Family history unknown: Yes    Social History Social History    Tobacco Use   Smoking status: Never Smoker   Smokeless tobacco: Never Used  Substance Use Topics   Alcohol use: No   Drug use: No    Allergies  Allergen Reactions   Naprosyn [Naproxen]     Current Outpatient Medications  Medication Sig Dispense Refill   ALPRAZolam (XANAX) 0.5 MG tablet Take 0.5 mg by mouth 3 (three) times daily.     cyclobenzaprine (FLEXERIL) 10 MG tablet One tablet twice a day as needed for spasm 60 tablet 2   dicyclomine (BENTYL) 10 MG capsule TAKE ONE CAPSULE BY MOUTH 4 TIMES DAILY-BEFORE MEALS AND AT BEDTIME. 90 capsule 0   HYDROcodone-acetaminophen (NORCO/VICODIN) 5-325 MG tablet One tablet by mouth every six hours as needed for pain. 120 tablet 0   levothyroxine (SYNTHROID) 75 MCG tablet      lisinopril (PRINIVIL,ZESTRIL) 20 MG tablet      omeprazole (PRILOSEC) 20 MG capsule Take 1 capsule (20 mg total) by mouth daily.     zolpidem (AMBIEN) 10 MG tablet Take 10 mg by mouth at bedtime as needed for sleep.     No current facility-administered medications for this visit.     Physical Exam  Blood pressure (!) 160/98, pulse 96, height 5\' 7"  (1.702 m), weight 189 lb (85.7 kg).  Constitutional: overall normal hygiene, normal nutrition, well developed, normal grooming, normal body habitus. Assistive device:none  Musculoskeletal: gait and station Limp none, muscle tone and strength are normal, no tremors or atrophy is present.   Neurological: coordination overall normal.  Deep tendon reflex/nerve stretch intact.  Sensation normal.  Cranial nerves II-XII intact.   Skin:   Normal overall no scars, lesions, ulcers or rashes. No psoriasis.  Psychiatric: Alert and oriented x 3.  Recent memory intact, remote memory unclear.  Normal mood and affect. Well groomed.  Good eye contact.  Cardiovascular: overall no swelling, no varicosities, no edema bilaterally, normal temperatures of the legs and arms, no clubbing, cyanosis and good capillary  refill.  Lymphatic: palpation is normal.  Spine/Pelvis examination:  Inspection:  Overall, sacoiliac joint benign and hips nontender; without crepitus or defects.   Thoracic spine inspection: Alignment normal without kyphosis present   Lumbar spine inspection:  Alignment  with normal lumbar lordosis, without scoliosis apparent.   Thoracic spine palpation:  without tenderness of spinal processes   Lumbar spine palpation: without tenderness of lumbar area; without tightness of lumbar muscles    Range of Motion:   Lumbar flexion, forward flexion is normal without pain or tenderness    Lumbar extension is full without pain or tenderness   Left lateral bend is normal without pain or tenderness   Right lateral bend is normal without pain or tenderness   Straight leg raising is normal  Strength & tone: normal   Stability overall normal stability  All other systems reviewed and are negative   The patient has been educated about the nature of the problem(s) and counseled on treatment options.  The patient appeared to understand what I have discussed and is in agreement with it.  Encounter Diagnoses  Name Primary?   Chronic pain syndrome Yes   Elbow pain, chronic, left    Chronic right-sided low back pain with right-sided sciatica     PLAN Call if any problems.  Precautions discussed.  Continue current medications.   Return to clinic 3 months   I have reviewed the [facility] Controlled Substance Reporting System web site prior to prescribing narcotic medicine for this patient.   Electronically Signed Darreld Mclean, MD 10/14/202110:48 AM

## 2020-06-21 ENCOUNTER — Ambulatory Visit: Payer: Medicare Other

## 2020-06-21 ENCOUNTER — Ambulatory Visit (INDEPENDENT_AMBULATORY_CARE_PROVIDER_SITE_OTHER): Payer: Medicare Other | Admitting: Orthopaedic Surgery

## 2020-06-21 ENCOUNTER — Other Ambulatory Visit: Payer: Self-pay

## 2020-06-21 ENCOUNTER — Encounter: Payer: Self-pay | Admitting: Orthopaedic Surgery

## 2020-06-21 VITALS — BP 170/100 | HR 84 | Ht 67.0 in | Wt 188.1 lb

## 2020-06-21 DIAGNOSIS — M25562 Pain in left knee: Secondary | ICD-10-CM

## 2020-06-21 DIAGNOSIS — G8929 Other chronic pain: Secondary | ICD-10-CM

## 2020-06-21 NOTE — Progress Notes (Signed)
Patient [id number] Kim Schultz, female DOB:Jul 09, 1941, 79 y.o. [id number]  Chief Complaint  Patient presents with  . Knee Pain    L/hurts bad  . Hip Pain    L/hurts real bad/pain radiates down to foot    HPI  Kim Schultz is a 79 y.o. female who has more pain in the left knee with swelling and giving way.  She has fallen three times as the knee gave way.  She has more pain, more popping.  I will get MRI of the left knee.   Body mass index is 29.46 kg/m.  ROS  Review of Systems  Constitutional: Positive for activity change.       Patient does not have Diabetes Mellitus. Patient does not have hypertension. Patient does not have COPD or shortness of breath. Patient does not have BMI > 35. Patient does not have current smoking history.  HENT: Negative for congestion.   Respiratory: Negative for cough and shortness of breath.   Cardiovascular: Negative for chest pain.  Endocrine: Positive for cold intolerance.  Musculoskeletal: Positive for arthralgias, back pain, gait problem, joint swelling and myalgias.  Allergic/Immunologic: Positive for environmental allergies.  Psychiatric/Behavioral: The patient is nervous/anxious.   All other systems reviewed and are negative.   All other systems reviewed and are negative.  The following is a summary of the past history medically, past history surgically, known current medicines, social history and family history.  This information is gathered electronically by the computer from prior information and documentation.  I review this each visit and have found including this information at this point in the chart is beneficial and informative.    Past Medical History:  Diagnosis Date  . Arthritis   . GERD (gastroesophageal reflux disease)   . Hypertension   . Thyroid disease     Past Surgical History:  Procedure Laterality Date  . ABDOMINAL HYSTERECTOMY    . ELBOW SURGERY Left   . TONSILLECTOMY      Family History  Family  history unknown: Yes    Social History Social History   Tobacco Use  . Smoking status: Never Smoker  . Smokeless tobacco: Never Used  Substance Use Topics  . Alcohol use: No  . Drug use: No    Allergies  Allergen Reactions  . Naprosyn [Naproxen]     Current Outpatient Medications  Medication Sig Dispense Refill  . ALPRAZolam (XANAX) 0.5 MG tablet Take 0.5 mg by mouth 3 (three) times daily.    . cyclobenzaprine (FLEXERIL) 10 MG tablet One tablet twice a day as needed for spasm 60 tablet 2  . dicyclomine (BENTYL) 10 MG capsule TAKE ONE CAPSULE BY MOUTH 4 TIMES DAILY-BEFORE MEALS AND AT BEDTIME. 90 capsule 0  . HYDROcodone-acetaminophen (NORCO/VICODIN) 5-325 MG tablet One tablet by mouth every six hours as needed for pain. 120 tablet 0  . levothyroxine (SYNTHROID) 75 MCG tablet     . lisinopril (PRINIVIL,ZESTRIL) 20 MG tablet     . omeprazole (PRILOSEC) 20 MG capsule Take 1 capsule (20 mg total) by mouth daily.    Marland Kitchen zolpidem (AMBIEN) 10 MG tablet Take 10 mg by mouth at bedtime as needed for sleep.     No current facility-administered medications for this visit.     Physical Exam  Blood pressure (!) 170/100, pulse 84, height 5\' 7"  (1.702 m), weight 188 lb 2 oz (85.3 kg).  Constitutional: overall normal hygiene, normal nutrition, well developed, normal grooming, normal body habitus. Assistive device:none  Musculoskeletal: gait and  station Limp left, muscle tone and strength are normal, no tremors or atrophy is present.  .  Neurological: coordination overall normal.  Deep tendon reflex/nerve stretch intact.  Sensation normal.  Cranial nerves II-XII intact.   Skin:   Normal overall no scars, lesions, ulcers or rashes. No psoriasis.  Psychiatric: Alert and oriented x 3.  Recent memory intact, remote memory unclear.  Normal mood and affect. Well groomed.  Good eye contact.  Cardiovascular: overall no swelling, no varicosities, no edema bilaterally, normal temperatures of the  legs and arms, no clubbing, cyanosis and good capillary refill.  Lymphatic: palpation is normal.  Left knee with medial pain, effusion, crepitus, ROM 0 to 105, positive medial [place], NV intact.  Limp left.  All other systems reviewed and are negative   The patient has been educated about the nature of the problem(s) and counseled on treatment options.  The patient appeared to understand what I have discussed and is in agreement with it.  Encounter Diagnosis  Name Primary?  . Chronic pain of left knee Yes   X-rays were done of the left knee, reported separately.  PROCEDURE NOTE:  The patient requests injections of the left knee , verbal consent was obtained.  The left knee was prepped appropriately after time out was performed.   Sterile technique was observed and injection of 1 cc of Depo-Medrol 40 mg with several cc's of plain xylocaine. Anesthesia was provided by ethyl chloride and a 20-gauge needle was used to inject the knee area. The injection was tolerated well.  A band aid dressing was applied.  The patient was advised to apply ice later today and tomorrow to the injection sight as needed.  PLAN Call if any problems.  Precautions discussed.  Continue current medications.   Return to clinic 3 weeks   Get MRI of the left knee.  Electronically Signed Darreld Mclean, MD 12/14/202111:12 AM

## 2020-06-24 ENCOUNTER — Telehealth: Payer: Self-pay | Admitting: Orthopaedic Surgery

## 2020-06-24 NOTE — Telephone Encounter (Signed)
I called and spoke with Kim Schultz and explained that she needs to have the MRI for her knee and when she comes in to go over it with Dr. Hilda Lias she can discuss about her hip with him. She stated that when she bends over it really hurts so I told her to use ice on her hip and she stated that she was using ice and heat, I told her that is fine. I told her of her appointment time for her MRI on 12/28 and told her to be there at least 15 to 30 minutes earlier and she said she would.

## 2020-06-24 NOTE — Telephone Encounter (Signed)
Patient wants to have an MRI of her hip instead of her knee.  She said that her hip was hurting.  I told her that Dr. Hilda Lias ordered the MRI for her knee and that we couldn't change his order.  He is out of the office for the next 2 weeks.  She said if she bends over her hip hurts.  I told her that I would have Jodene Nam, CMA call her so that they could discuss this.

## 2020-07-05 ENCOUNTER — Ambulatory Visit (HOSPITAL_COMMUNITY): Payer: Medicare Other

## 2020-07-12 ENCOUNTER — Ambulatory Visit: Payer: Medicare Other | Admitting: Orthopaedic Surgery

## 2020-07-26 ENCOUNTER — Ambulatory Visit: Payer: Medicare Other | Admitting: Orthopaedic Surgery

## 2020-08-02 ENCOUNTER — Ambulatory Visit (INDEPENDENT_AMBULATORY_CARE_PROVIDER_SITE_OTHER): Payer: Medicare Other | Admitting: Orthopaedic Surgery

## 2020-08-02 ENCOUNTER — Encounter: Payer: Self-pay | Admitting: Orthopaedic Surgery

## 2020-08-02 ENCOUNTER — Other Ambulatory Visit: Payer: Self-pay

## 2020-08-02 VITALS — BP 154/97 | HR 104 | Ht 67.0 in | Wt 183.0 lb

## 2020-08-02 DIAGNOSIS — M25562 Pain in left knee: Secondary | ICD-10-CM | POA: Diagnosis not present

## 2020-08-02 DIAGNOSIS — G894 Chronic pain syndrome: Secondary | ICD-10-CM | POA: Diagnosis not present

## 2020-08-02 DIAGNOSIS — M5441 Lumbago with sciatica, right side: Secondary | ICD-10-CM | POA: Diagnosis not present

## 2020-08-02 DIAGNOSIS — G8929 Other chronic pain: Secondary | ICD-10-CM

## 2020-08-02 MED ORDER — HYDROCODONE-ACETAMINOPHEN 5-325 MG PO TABS: ORAL_TABLET | ORAL | 0 refills | Status: DC

## 2020-08-02 MED ORDER — CYCLOBENZAPRINE HCL 10 MG PO TABS: ORAL_TABLET | ORAL | 2 refills | Status: DC

## 2020-08-02 NOTE — Progress Notes (Signed)
Patient EP:PIRJJOAC Kim Schultz, female DOB:1941/03/06, 80 y.o. ZYS:[id number]  Chief Complaint  Patient presents with  . Pain    HPI  Kim Schultz is a 80 y.o. female who has continued pain of the left knee.  It gives way. She has fallen four times in the last few weeks because of this.  She was to have MRI but did not get it done secondary to the bad weather and the snow.  We will try to arrange this now.  She has no redness, no numbness.   Body mass index is 28.66 kg/m.  ROS  Review of Systems  Constitutional: Positive for activity change.       Patient does not have Diabetes Mellitus. Patient does not have hypertension. Patient does not have COPD or shortness of breath. Patient does not have BMI > 35. Patient does not have current smoking history.  HENT: Negative for congestion.   Respiratory: Negative for cough and shortness of breath.   Cardiovascular: Negative for chest pain.  Endocrine: Positive for cold intolerance.  Musculoskeletal: Positive for arthralgias, back pain, gait problem, joint swelling and myalgias.  Allergic/Immunologic: Positive for environmental allergies.  Psychiatric/Behavioral: The patient is nervous/anxious.   All other systems reviewed and are negative.   All other systems reviewed and are negative.  The following is a summary of the past history medically, past history surgically, known current medicines, social history and family history.  This information is gathered electronically by the computer from prior information and documentation.  I review this each visit and have found including this information at this point in the chart is beneficial and informative.    Past Medical History:  Diagnosis Date  . Arthritis   . GERD (gastroesophageal reflux disease)   . Hypertension   . Thyroid disease     Past Surgical History:  Procedure Laterality Date  . ABDOMINAL HYSTERECTOMY    . ELBOW SURGERY Left   . TONSILLECTOMY      Family History   Family history unknown: Yes    Social History Social History   Tobacco Use  . Smoking status: Never Smoker  . Smokeless tobacco: Never Used  Substance Use Topics  . Alcohol use: No  . Drug use: No    Allergies  Allergen Reactions  . Naprosyn [Naproxen]     Current Outpatient Medications  Medication Sig Dispense Refill  . ALPRAZolam (XANAX) 0.5 MG tablet Take 0.5 mg by mouth 3 (three) times daily.    . cyclobenzaprine (FLEXERIL) 10 MG tablet One tablet twice a day as needed for spasm 60 tablet 2  . dicyclomine (BENTYL) 10 MG capsule TAKE ONE CAPSULE BY MOUTH 4 TIMES DAILY-BEFORE MEALS AND AT BEDTIME. 90 capsule 0  . HYDROcodone-acetaminophen (NORCO/VICODIN) 5-325 MG tablet One tablet by mouth every six hours as needed for pain. 120 tablet 0  . levothyroxine (SYNTHROID) 75 MCG tablet     . lisinopril (PRINIVIL,ZESTRIL) 20 MG tablet     . omeprazole (PRILOSEC) 20 MG capsule Take 1 capsule (20 mg total) by mouth daily.    Marland Kitchen zolpidem (AMBIEN) 10 MG tablet Take 10 mg by mouth at bedtime as needed for sleep.     No current facility-administered medications for this visit.     Physical Exam  Blood pressure (!) 154/97, pulse (!) 104, height 5\' 7"  (1.702 m), weight 183 lb (83 kg).  Constitutional: overall normal hygiene, normal nutrition, well developed, normal grooming, normal body habitus. Assistive device:none  Musculoskeletal: gait and station  Limp left, muscle tone and strength are normal, no tremors or atrophy is present.  .  Neurological: coordination overall normal.  Deep tendon reflex/nerve stretch intact.  Sensation normal.  Cranial nerves II-XII intact.   Skin:   Normal overall no scars, lesions, ulcers or rashes. No psoriasis.  Psychiatric: Alert and oriented x 3.  Recent memory intact, remote memory unclear.  Normal mood and affect. Well groomed.  Good eye contact.  Cardiovascular: overall no swelling, no varicosities, no edema bilaterally, normal temperatures of  the legs and arms, no clubbing, cyanosis and good capillary refill.  Left knee is painful, ROM 0 to 105, crepitus, effusion, positive medial McMurray.  Limp left.  Lymphatic: palpation is normal.  All other systems reviewed and are negative   The patient has been educated about the nature of the problem(s) and counseled on treatment options.  The patient appeared to understand what I have discussed and is in agreement with it.  Encounter Diagnoses  Name Primary?  . Chronic pain of left knee Yes  . Chronic pain syndrome   . Chronic right-sided low back pain with right-sided sciatica     PLAN Call if any problems.  Precautions discussed.  Continue current medications.   Return to clinic 2 weeks   I have renewed her Norco and Flexeril.  I have reviewed the [place] Controlled Substance Reporting System web site prior to prescribing narcotic medicine for this patient.   Electronically Signed Darreld Mclean, MD 1/25/20222:57 PM

## 2020-08-16 ENCOUNTER — Ambulatory Visit (HOSPITAL_COMMUNITY)
Admission: RE | Admit: 2020-08-16 | Discharge: 2020-08-16 | Disposition: A | Discharge status: Home / Self Care | Payer: Medicare Other | Source: Ambulatory Visit | Attending: Orthopaedic Surgery | Admitting: Orthopaedic Surgery

## 2020-08-16 ENCOUNTER — Ambulatory Visit: Payer: Medicare Other | Admitting: Orthopaedic Surgery

## 2020-08-16 ENCOUNTER — Other Ambulatory Visit: Payer: Self-pay

## 2020-08-16 DIAGNOSIS — M25562 Pain in left knee: Secondary | ICD-10-CM | POA: Diagnosis present

## 2020-08-16 DIAGNOSIS — G8929 Other chronic pain: Secondary | ICD-10-CM | POA: Insufficient documentation

## 2020-08-23 ENCOUNTER — Other Ambulatory Visit: Payer: Self-pay

## 2020-08-23 ENCOUNTER — Ambulatory Visit (INDEPENDENT_AMBULATORY_CARE_PROVIDER_SITE_OTHER): Payer: Medicare Other | Admitting: Orthopaedic Surgery

## 2020-08-23 ENCOUNTER — Encounter: Payer: Self-pay | Admitting: Orthopaedic Surgery

## 2020-08-23 DIAGNOSIS — M25562 Pain in left knee: Secondary | ICD-10-CM

## 2020-08-23 DIAGNOSIS — G894 Chronic pain syndrome: Secondary | ICD-10-CM

## 2020-08-23 DIAGNOSIS — G8929 Other chronic pain: Secondary | ICD-10-CM

## 2020-08-23 NOTE — Progress Notes (Signed)
Patient Kim Schultz, female DOB:May 18, 1941, 80 y.o. TDD:[id number]  Chief Complaint  Patient presents with  . Knee Pain    Left knee pain, Go over MRI, Patient reports she is about the same,    HPI  Kim Schultz is a 80 y.o. female who has continued pain in the left knee.  She had MRI which showed: IMPRESSION: 1. Diminutive and torn medial meniscus posterior horn. 2. Small radial tears of the lateral meniscus anterior and posterior horns. Additional small oblique undersurface tear of the posterior horn. 3. 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 4. 2.6 cm ganglion cyst adjacent to the proximal MCL.  She says she has pain at times and cannot stand and other times the knee gives way.  She is in pain and does not like it.  I have explained the findings to her.  I have recommended she consider arthroscopy.  She wants to think about.  Her home situation is such that she will need someone to stay with her if she has surgery.  She will let me know.   There is no height or weight on file to calculate BMI.  ROS  Review of Systems  Constitutional: Positive for activity change.       Patient does not have Diabetes Mellitus. Patient does not have hypertension. Patient does not have COPD or shortness of breath. Patient does not have BMI > 35. Patient does not have current smoking history.  HENT: Negative for congestion.   Respiratory: Negative for cough and shortness of breath.   Cardiovascular: Negative for chest pain.  Endocrine: Positive for cold intolerance.  Musculoskeletal: Positive for arthralgias, back pain, gait problem, joint swelling and myalgias.  Allergic/Immunologic: Positive for environmental allergies.  Psychiatric/Behavioral: The patient is nervous/anxious.   All other systems reviewed and are negative.   All other systems reviewed and are negative.  The following is a summary of the past history  medically, past history surgically, known current medicines, social history and family history.  This information is gathered electronically by the computer from prior information and documentation.  I review this each visit and have found including this information at this point in the chart is beneficial and informative.    Past Medical History:  Diagnosis Date  . Arthritis   . GERD (gastroesophageal reflux disease)   . Hypertension   . Thyroid disease     Past Surgical History:  Procedure Laterality Date  . ABDOMINAL HYSTERECTOMY    . ELBOW SURGERY Left   . TONSILLECTOMY      Family History  Family history unknown: Yes    Social History Social History   Tobacco Use  . Smoking status: Never Smoker  . Smokeless tobacco: Never Used  Substance Use Topics  . Alcohol use: No  . Drug use: No    Allergies  Allergen Reactions  . Naprosyn [Naproxen] Other (See Comments)    Intolerance     Current Outpatient Medications  Medication Sig Dispense Refill  . ALPRAZolam (XANAX) 0.5 MG tablet Take 0.5 mg by mouth 3 (three) times daily.    . cyclobenzaprine (FLEXERIL) 10 MG tablet One tablet twice a day as needed for spasm 60 tablet 2  . dicyclomine (BENTYL) 10 MG capsule TAKE ONE CAPSULE BY MOUTH 4 TIMES DAILY-BEFORE MEALS AND AT BEDTIME. (Patient taking differently: 10 mg as needed.) 90 capsule 0  . HYDROcodone-acetaminophen (NORCO/VICODIN) 5-325 MG tablet One tablet by mouth every six hours as needed for  pain. 120 tablet 0  . levothyroxine (SYNTHROID) 75 MCG tablet     . lisinopril (PRINIVIL,ZESTRIL) 20 MG tablet     . omeprazole (PRILOSEC) 20 MG capsule Take 1 capsule (20 mg total) by mouth daily.    Marland Kitchen zolpidem (AMBIEN) 10 MG tablet Take 10 mg by mouth at bedtime as needed for sleep.     No current facility-administered medications for this visit.     Physical Exam  There were no vitals taken for this visit.  Constitutional: overall normal hygiene, normal nutrition,  well developed, normal grooming, normal body habitus. Assistive device:cane  Musculoskeletal: gait and station Limp left, muscle tone and strength are normal, no tremors or atrophy is present.  .  Neurological: coordination overall normal.  Deep tendon reflex/nerve stretch intact.  Sensation normal.  Cranial nerves II-XII intact.   Skin:   Normal overall no scars, lesions, ulcers or rashes. No psoriasis.  Psychiatric: Alert and oriented x 3.  Recent memory intact, remote memory unclear.  Normal mood and affect. Well groomed.  Good eye contact.  Cardiovascular: overall no swelling, no varicosities, no edema bilaterally, normal temperatures of the legs and arms, no clubbing, cyanosis and good capillary refill.  Lymphatic: palpation is normal.  Left knee is tender medially, she has a cane, limp to the left, effusion, crepitus and positive medial McMurray.  NV intact.  All other systems reviewed and are negative   The patient has been educated about the nature of the problem(s) and counseled on treatment options.  The patient appeared to understand what I have discussed and is in agreement with it.  Encounter Diagnoses  Name Primary?  . Chronic pain of left knee Yes  . Chronic pain syndrome     PLAN Call if any problems.  Precautions discussed.  Continue current medications.   Return to clinic 2 weeks   Electronically Signed Darreld Mclean, MD 2/15/20222:41 PM

## 2020-09-06 ENCOUNTER — Encounter: Payer: Self-pay | Admitting: Orthopaedic Surgery

## 2020-09-06 ENCOUNTER — Ambulatory Visit (INDEPENDENT_AMBULATORY_CARE_PROVIDER_SITE_OTHER): Payer: Medicare Other | Admitting: Orthopaedic Surgery

## 2020-09-06 ENCOUNTER — Other Ambulatory Visit: Payer: Self-pay

## 2020-09-06 VITALS — BP 158/106 | HR 100 | Ht 67.0 in | Wt 185.0 lb

## 2020-09-06 DIAGNOSIS — G8929 Other chronic pain: Secondary | ICD-10-CM | POA: Diagnosis not present

## 2020-09-06 DIAGNOSIS — G894 Chronic pain syndrome: Secondary | ICD-10-CM

## 2020-09-06 DIAGNOSIS — M25562 Pain in left knee: Secondary | ICD-10-CM | POA: Diagnosis not present

## 2020-09-06 NOTE — Progress Notes (Signed)
Patient Kim Schultz, female DOB:04-23-1941, 80 y.o. JHE:[id number]  Chief Complaint  Patient presents with  . Knee Pain    Left     HPI  Kim Schultz is a 80 y.o. female who has left knee pain.  She has decided to postpone any surgery for now.  She has other issues to deal with she says.  I told her it is elective.  She has pain in the left knee, has swelling and giving way.  She has no new trauma.   Body mass index is 28.98 kg/m.  ROS  Review of Systems  Constitutional: Positive for activity change.       Patient does not have Diabetes Mellitus. Patient does not have hypertension. Patient does not have COPD or shortness of breath. Patient does not have BMI > 35. Patient does not have current smoking history.  HENT: Negative for congestion.   Respiratory: Negative for cough and shortness of breath.   Cardiovascular: Negative for chest pain.  Endocrine: Positive for cold intolerance.  Musculoskeletal: Positive for arthralgias, back pain, gait problem, joint swelling and myalgias.  Allergic/Immunologic: Positive for environmental allergies.  Psychiatric/Behavioral: The patient is nervous/anxious.   All other systems reviewed and are negative.   All other systems reviewed and are negative.  The following is a summary of the past history medically, past history surgically, known current medicines, social history and family history.  This information is gathered electronically by the computer from prior information and documentation.  I review this each visit and have found including this information at this point in the chart is beneficial and informative.    Past Medical History:  Diagnosis Date  . Arthritis   . GERD (gastroesophageal reflux disease)   . Hypertension   . Thyroid disease     Past Surgical History:  Procedure Laterality Date  . ABDOMINAL HYSTERECTOMY    . ELBOW SURGERY Left   . TONSILLECTOMY      Family History  Family history unknown: Yes     Social History Social History   Tobacco Use  . Smoking status: Never Smoker  . Smokeless tobacco: Never Used  Substance Use Topics  . Alcohol use: No  . Drug use: No    Allergies  Allergen Reactions  . Naprosyn [Naproxen] Other (See Comments)    Intolerance     Current Outpatient Medications  Medication Sig Dispense Refill  . ALPRAZolam (XANAX) 0.5 MG tablet Take 0.5 mg by mouth 3 (three) times daily.    . cyclobenzaprine (FLEXERIL) 10 MG tablet One tablet twice a day as needed for spasm 60 tablet 2  . dicyclomine (BENTYL) 10 MG capsule TAKE ONE CAPSULE BY MOUTH 4 TIMES DAILY-BEFORE MEALS AND AT BEDTIME. (Patient taking differently: 10 mg as needed.) 90 capsule 0  . HYDROcodone-acetaminophen (NORCO/VICODIN) 5-325 MG tablet One tablet by mouth every six hours as needed for pain. 120 tablet 0  . levothyroxine (SYNTHROID) 75 MCG tablet     . lisinopril (PRINIVIL,ZESTRIL) 20 MG tablet     . omeprazole (PRILOSEC) 20 MG capsule Take 1 capsule (20 mg total) by mouth daily.    Marland Kitchen zolpidem (AMBIEN) 10 MG tablet Take 10 mg by mouth at bedtime as needed for sleep.     No current facility-administered medications for this visit.     Physical Exam  Blood pressure (!) 158/106, pulse 100, height 5\' 7"  (1.702 m), weight 185 lb (83.9 kg).  Constitutional: overall normal hygiene, normal nutrition, well developed, normal grooming,  normal body habitus. Assistive device:none  Musculoskeletal: gait and station Limp left, muscle tone and strength are normal, no tremors or atrophy is present.  .  Neurological: coordination overall normal.  Deep tendon reflex/nerve stretch intact.  Sensation normal.  Cranial nerves II-XII intact.   Skin:   Normal overall no scars, lesions, ulcers or rashes. No psoriasis.  Psychiatric: Alert and oriented x 3.  Recent memory intact, remote memory unclear.  Normal mood and affect. Well groomed.  Good eye contact.  Cardiovascular: overall no swelling, no  varicosities, no edema bilaterally, normal temperatures of the legs and arms, no clubbing, cyanosis and good capillary refill.  Lymphatic: palpation is normal.  Left knee with pain, effusion, crepitus, ROM 0 to 105, limp left, positive medial McMurray.  All other systems reviewed and are negative   The patient has been educated about the nature of the problem(s) and counseled on treatment options.  The patient appeared to understand what I have discussed and is in agreement with it.  Encounter Diagnoses  Name Primary?  . Chronic pain of left knee Yes  . Chronic pain syndrome     PLAN Call if any problems.  Precautions discussed.  Continue current medications.   Return to clinic 6 weeks   Electronically Signed Darreld Mclean, MD 3/1/20222:17 PM

## 2020-10-18 ENCOUNTER — Ambulatory Visit (INDEPENDENT_AMBULATORY_CARE_PROVIDER_SITE_OTHER): Payer: Medicare Other | Admitting: Orthopaedic Surgery

## 2020-10-18 ENCOUNTER — Other Ambulatory Visit: Payer: Self-pay

## 2020-10-18 ENCOUNTER — Encounter: Payer: Self-pay | Admitting: Orthopaedic Surgery

## 2020-10-18 VITALS — BP 169/100 | HR 100 | Ht 67.0 in | Wt 189.0 lb

## 2020-10-18 DIAGNOSIS — G8929 Other chronic pain: Secondary | ICD-10-CM

## 2020-10-18 DIAGNOSIS — G894 Chronic pain syndrome: Secondary | ICD-10-CM | POA: Diagnosis not present

## 2020-10-18 DIAGNOSIS — M25562 Pain in left knee: Secondary | ICD-10-CM | POA: Diagnosis not present

## 2020-10-18 MED ORDER — HYDROCODONE-ACETAMINOPHEN 5-325 MG PO TABS: ORAL_TABLET | ORAL | 0 refills | Status: DC

## 2020-10-18 MED ORDER — CYCLOBENZAPRINE HCL 10 MG PO TABS: ORAL_TABLET | ORAL | 2 refills | Status: DC

## 2020-10-18 NOTE — Progress Notes (Signed)
PROCEDURE NOTE:  The patient requests injections of the left knee , verbal consent was obtained.  The left knee was prepped appropriately after time out was performed.   Sterile technique was observed and injection of 1 cc of Celestone 6 mg with several cc's of plain xylocaine. Anesthesia was provided by ethyl chloride and a 20-gauge needle was used to inject the knee area. The injection was tolerated well.  A band aid dressing was applied.  The patient was advised to apply ice later today and tomorrow to the injection sight as needed.  I have reviewed the [place] Controlled Substance Reporting System web site prior to prescribing narcotic medicine for this patient.   Return in six weeks.  Call if any problem.  Precautions discussed.   Electronically Signed Darreld Mclean, MD 4/12/20222:32 PM

## 2020-11-29 ENCOUNTER — Encounter: Payer: Self-pay | Admitting: Orthopaedic Surgery

## 2020-11-29 ENCOUNTER — Other Ambulatory Visit: Payer: Self-pay

## 2020-11-29 ENCOUNTER — Ambulatory Visit (INDEPENDENT_AMBULATORY_CARE_PROVIDER_SITE_OTHER): Payer: Medicare Other | Admitting: Orthopaedic Surgery

## 2020-11-29 VITALS — BP 169/92 | HR 97 | Ht 67.0 in | Wt 185.4 lb

## 2020-11-29 DIAGNOSIS — G8929 Other chronic pain: Secondary | ICD-10-CM

## 2020-11-29 DIAGNOSIS — M25562 Pain in left knee: Secondary | ICD-10-CM | POA: Diagnosis not present

## 2020-11-29 DIAGNOSIS — G894 Chronic pain syndrome: Secondary | ICD-10-CM

## 2020-11-29 DIAGNOSIS — M25522 Pain in left elbow: Secondary | ICD-10-CM

## 2020-11-29 NOTE — Progress Notes (Signed)
Patient DX:AJOINOMV Kim Schultz, female DOB:1940/10/31, 81 y.o. EHM:[id number]  Chief Complaint  Patient presents with  . Knee Pain    Left knee pain  . Arm Pain    Left arm pain/hx prior sx    HPI  Kim Schultz is a 80 y.o. female who has left knee, left elbow and lower back pain.  She has no new trauma. She has done well since the injection in the left knee.  It hurts still and swells but does not give way.  She is taking her medicine for her back and knee and it helps.   Body mass index is 29.04 kg/m.  ROS  Review of Systems  Constitutional: Positive for activity change.       Patient does not have Diabetes Mellitus. Patient does not have hypertension. Patient does not have COPD or shortness of breath. Patient does not have BMI > 35. Patient does not have current smoking history.  HENT: Negative for congestion.   Respiratory: Negative for cough and shortness of breath.   Cardiovascular: Negative for chest pain.  Endocrine: Positive for cold intolerance.  Musculoskeletal: Positive for arthralgias, back pain, gait problem, joint swelling and myalgias.  Allergic/Immunologic: Positive for environmental allergies.  Psychiatric/Behavioral: The patient is nervous/anxious.   All other systems reviewed and are negative.   All other systems reviewed and are negative.  The following is a summary of the past history medically, past history surgically, known current medicines, social history and family history.  This information is gathered electronically by the computer from prior information and documentation.  I review this each visit and have found including this information at this point in the chart is beneficial and informative.    Past Medical History:  Diagnosis Date  . Arthritis   . GERD (gastroesophageal reflux disease)   . Hypertension   . Thyroid disease     Past Surgical History:  Procedure Laterality Date  . ABDOMINAL HYSTERECTOMY    . ELBOW SURGERY Left   .  TONSILLECTOMY      Family History  Family history unknown: Yes    Social History Social History   Tobacco Use  . Smoking status: Never Smoker  . Smokeless tobacco: Never Used  Substance Use Topics  . Alcohol use: No  . Drug use: No    Allergies  Allergen Reactions  . Naprosyn [Naproxen] Other (See Comments)    Intolerance     Current Outpatient Medications  Medication Sig Dispense Refill  . ALPRAZolam (XANAX) 0.5 MG tablet Take 0.5 mg by mouth 3 (three) times daily.    . cyclobenzaprine (FLEXERIL) 10 MG tablet One tablet twice a day as needed for spasm 60 tablet 2  . dicyclomine (BENTYL) 10 MG capsule TAKE ONE CAPSULE BY MOUTH 4 TIMES DAILY-BEFORE MEALS AND AT BEDTIME. (Patient taking differently: 10 mg as needed.) 90 capsule 0  . HYDROcodone-acetaminophen (NORCO/VICODIN) 5-325 MG tablet One tablet by mouth every six hours as needed for pain. 120 tablet 0  . levothyroxine (SYNTHROID) 75 MCG tablet     . lisinopril (PRINIVIL,ZESTRIL) 20 MG tablet     . omeprazole (PRILOSEC) 20 MG capsule Take 1 capsule (20 mg total) by mouth daily.    Marland Kitchen zolpidem (AMBIEN) 10 MG tablet Take 10 mg by mouth at bedtime as needed for sleep.     No current facility-administered medications for this visit.     Physical Exam  Blood pressure (!) 169/92, pulse 97, height 5\' 7"  (1.702 m), weight 185  lb 6.4 oz (84.1 kg).  Constitutional: overall normal hygiene, normal nutrition, well developed, normal grooming, normal body habitus. Assistive device:none  Musculoskeletal: gait and station Limp left, muscle tone and strength are normal, no tremors or atrophy is present.  .  Neurological: coordination overall normal.  Deep tendon reflex/nerve stretch intact.  Sensation normal.  Cranial nerves II-XII intact.   Skin:   Normal overall no scars, lesions, ulcers or rashes. No psoriasis.  Psychiatric: Alert and oriented x 3.  Recent memory intact, remote memory unclear.  Normal mood and affect. Well  groomed.  Good eye contact.  Cardiovascular: overall no swelling, no varicosities, no edema bilaterally, normal temperatures of the legs and arms, no clubbing, cyanosis and good capillary refill.  Lymphatic: palpation is normal.  Left knee with effusion, crepitus, ROM 0 to 105, limp left, NV Intact.  Spine/Pelvis examination:  Inspection:  Overall, sacoiliac joint benign and hips nontender; without crepitus or defects.   Thoracic spine inspection: Alignment normal without kyphosis present   Lumbar spine inspection:  Alignment  with normal lumbar lordosis, without scoliosis apparent.   Thoracic spine palpation:  without tenderness of spinal processes   Lumbar spine palpation: without tenderness of lumbar area; without tightness of lumbar muscles    Range of Motion:   Lumbar flexion, forward flexion is normal without pain or tenderness    Lumbar extension is full without pain or tenderness   Left lateral bend is normal without pain or tenderness   Right lateral bend is normal without pain or tenderness   Straight leg raising is normal  Strength & tone: normal   Stability overall normal stability  All other systems reviewed and are negative   The patient has been educated about the nature of the problem(s) and counseled on treatment options.  The patient appeared to understand what I have discussed and is in agreement with it.  Encounter Diagnoses  Name Primary?  . Chronic pain of left knee Yes  . Chronic pain syndrome   . Elbow pain, chronic, left     PLAN Call if any problems.  Precautions discussed.  Continue current medications.   Return to clinic 3 months   Electronically Signed Darreld Mclean, MD 5/24/20222:16 PM

## 2021-02-17 ENCOUNTER — Telehealth: Payer: Self-pay | Admitting: Orthopaedic Surgery

## 2021-02-17 NOTE — Telephone Encounter (Signed)
Patient called requesting her prescription be refilled    cyclobenzaprine (FLEXERIL) 10 MG tablet   Pharmacy:  [facility]

## 2021-02-20 MED ORDER — CYCLOBENZAPRINE HCL 10 MG PO TABS: ORAL_TABLET | ORAL | 2 refills | Status: DC

## 2021-03-07 ENCOUNTER — Ambulatory Visit (INDEPENDENT_AMBULATORY_CARE_PROVIDER_SITE_OTHER): Payer: Medicare Other | Admitting: Orthopaedic Surgery

## 2021-03-07 ENCOUNTER — Encounter: Payer: Self-pay | Admitting: Orthopaedic Surgery

## 2021-03-07 ENCOUNTER — Other Ambulatory Visit: Payer: Self-pay

## 2021-03-07 VITALS — BP 155/93 | HR 93 | Ht 67.0 in | Wt 187.8 lb

## 2021-03-07 DIAGNOSIS — M25562 Pain in left knee: Secondary | ICD-10-CM

## 2021-03-07 DIAGNOSIS — G894 Chronic pain syndrome: Secondary | ICD-10-CM | POA: Diagnosis not present

## 2021-03-07 DIAGNOSIS — G8929 Other chronic pain: Secondary | ICD-10-CM

## 2021-03-07 DIAGNOSIS — J3489 Other specified disorders of nose and nasal sinuses: Secondary | ICD-10-CM | POA: Diagnosis not present

## 2021-03-07 MED ORDER — CEPHALEXIN 500 MG PO CAPS: ORAL_CAPSULE | ORAL | 0 refills | Status: DC

## 2021-03-07 MED ORDER — TRAMADOL HCL 50 MG PO TABS: 50.0000 mg | ORAL_TABLET | Freq: Four times a day (QID) | ORAL | 3 refills | Status: AC | PRN

## 2021-03-07 NOTE — Progress Notes (Addendum)
PROCEDURE NOTE:  The patient requests injections of the left knee , verbal consent was obtained.  The left knee was prepped appropriately after time out was performed.   Sterile technique was observed and injection of 1 cc of Celestone 6 mg with several cc's of plain xylocaine. Anesthesia was provided by ethyl chloride and a 20-gauge needle was used to inject the knee area. The injection was tolerated well.  A band aid dressing was applied.  The patient was advised to apply ice later today and tomorrow to the injection sight as needed.   I will call in Tramadol.  The hydrocodone has caused itching.  I have reviewed the [place] Controlled Substance Reporting System web site prior to prescribing narcotic medicine for this patient.  I will call in Keflex for sinus problem.  She has multiple problems.  I spent 30 minutes with her going over problems.  Return in six weeks.  Call if any problem.  Precautions discussed.  Electronically Signed Darreld Mclean, MD 8/30/20222:05 PM

## 2021-03-09 NOTE — Addendum Note (Signed)
Addended by: Earnstine Regal on: 03/09/2021 11:40 AM   Modules accepted: Level of Service

## 2021-04-18 ENCOUNTER — Ambulatory Visit: Payer: Medicare Other | Admitting: Orthopaedic Surgery

## 2021-04-25 ENCOUNTER — Encounter: Payer: Self-pay | Admitting: Orthopaedic Surgery

## 2021-04-25 ENCOUNTER — Ambulatory Visit (INDEPENDENT_AMBULATORY_CARE_PROVIDER_SITE_OTHER): Payer: Medicare Other | Admitting: Orthopaedic Surgery

## 2021-04-25 ENCOUNTER — Other Ambulatory Visit: Payer: Self-pay

## 2021-04-25 VITALS — BP 177/108 | HR 98 | Ht 67.0 in | Wt 186.0 lb

## 2021-04-25 DIAGNOSIS — G894 Chronic pain syndrome: Secondary | ICD-10-CM | POA: Diagnosis not present

## 2021-04-25 DIAGNOSIS — G8929 Other chronic pain: Secondary | ICD-10-CM

## 2021-04-25 DIAGNOSIS — M25562 Pain in left knee: Secondary | ICD-10-CM | POA: Diagnosis not present

## 2021-04-25 MED ORDER — CYCLOBENZAPRINE HCL 10 MG PO TABS: ORAL_TABLET | ORAL | 2 refills | Status: DC

## 2021-04-25 NOTE — Progress Notes (Signed)
I have lots of stress.  She has had problems at home and has a lot of stress.  She has chronic pain syndrome and this has not helped.  She says her left knee is slightly better and she does not want an injection today. She has swelling and pain and popping but no giving way.  The left elbow is tender.  Her left knee has effusion, crepitus and ROM 0 to 105. She has slight limp to the left.  NV intact.  Knee is stable.  Encounter Diagnoses  Name Primary?   Chronic pain of left knee Yes   Chronic pain syndrome    I will see in three months.  I will refill her Flexeril.  Call if any problem.  Precautions discussed.  Electronically Signed Darreld Mclean, MD 10/18/20222:41 PM

## 2021-07-25 ENCOUNTER — Encounter: Payer: Self-pay | Admitting: Orthopaedic Surgery

## 2021-07-25 ENCOUNTER — Other Ambulatory Visit: Payer: Self-pay

## 2021-07-25 ENCOUNTER — Ambulatory Visit (INDEPENDENT_AMBULATORY_CARE_PROVIDER_SITE_OTHER): Payer: Medicare Other | Admitting: Orthopaedic Surgery

## 2021-07-25 VITALS — BP 156/88 | HR 88 | Ht 67.0 in | Wt 186.0 lb

## 2021-07-25 DIAGNOSIS — M25562 Pain in left knee: Secondary | ICD-10-CM | POA: Diagnosis not present

## 2021-07-25 DIAGNOSIS — G894 Chronic pain syndrome: Secondary | ICD-10-CM

## 2021-07-25 DIAGNOSIS — G8929 Other chronic pain: Secondary | ICD-10-CM | POA: Diagnosis not present

## 2021-07-25 MED ORDER — CYCLOBENZAPRINE HCL 10 MG PO TABS: ORAL_TABLET | ORAL | 2 refills | Status: DC

## 2021-07-25 NOTE — Progress Notes (Signed)
My knee is tender.  Her left knee has good and bad days.  She has no giving way.  She has no new trauma.  She has swelling and popping.  Her personal life is difficult now.  Left knee has effusion, crepitus, ROM 0 to 105, limp left, NV intact, stable.  Encounter Diagnoses  Name Primary?   Chronic pain of left knee Yes   Chronic pain syndrome    I will renew her flexeril.  Return in three months.  Call if any problem.  Precautions discussed.  Electronically Signed Darreld Mclean, MD 1/17/20232:50 PM

## 2021-10-10 ENCOUNTER — Ambulatory Visit (INDEPENDENT_AMBULATORY_CARE_PROVIDER_SITE_OTHER): Payer: Medicare Other | Admitting: Orthopaedic Surgery

## 2021-10-10 ENCOUNTER — Encounter: Payer: Self-pay | Admitting: Orthopaedic Surgery

## 2021-10-10 DIAGNOSIS — G8929 Other chronic pain: Secondary | ICD-10-CM

## 2021-10-10 DIAGNOSIS — M25562 Pain in left knee: Secondary | ICD-10-CM | POA: Diagnosis not present

## 2021-10-10 DIAGNOSIS — G894 Chronic pain syndrome: Secondary | ICD-10-CM | POA: Diagnosis not present

## 2021-10-10 MED ORDER — CYCLOBENZAPRINE HCL 10 MG PO TABS: ORAL_TABLET | ORAL | 2 refills | Status: DC

## 2021-10-10 NOTE — Progress Notes (Signed)
My knee is hurting a lot. ? ?She has increasing pain of the left knee.  MRI last year showed meniscus tear.  She does not want surgery. ? ?ROM 0 to 100 with pain medially.  Limp medially  and effusion. ? ?PROCEDURE NOTE: ? ?The patient requests injections of the left knee , verbal consent was obtained. ? ?The left knee was prepped appropriately after time out was performed.  ? ?Sterile technique was observed and injection of 1 cc of DepoMedrol 40 mg with several cc's of plain xylocaine. Anesthesia was provided by ethyl chloride and a 20-gauge needle was used to inject the knee area. The injection was tolerated well.  A band aid dressing was applied. ? ?The patient was advised to apply ice later today and tomorrow to the injection sight as needed. ? ?Encounter Diagnoses  ?Name Primary?  ? Chronic pain of left knee Yes  ? Chronic pain syndrome   ? ?Return in two weeks. ? ?Call if any problem. ? ?Precautions discussed. ? ?Electronically Signed ?Sanjuana Kava, MD ?4/4/202310:03 AM ? ?

## 2021-10-24 ENCOUNTER — Ambulatory Visit: Payer: Medicare Other | Admitting: Orthopaedic Surgery

## 2021-10-31 ENCOUNTER — Ambulatory Visit (INDEPENDENT_AMBULATORY_CARE_PROVIDER_SITE_OTHER): Payer: Medicare Other | Admitting: Orthopaedic Surgery

## 2021-10-31 ENCOUNTER — Encounter: Payer: Self-pay | Admitting: Orthopaedic Surgery

## 2021-10-31 VITALS — BP 146/81 | HR 95

## 2021-10-31 DIAGNOSIS — G894 Chronic pain syndrome: Secondary | ICD-10-CM

## 2021-10-31 DIAGNOSIS — G8929 Other chronic pain: Secondary | ICD-10-CM

## 2021-10-31 DIAGNOSIS — M25562 Pain in left knee: Secondary | ICD-10-CM | POA: Diagnosis not present

## 2021-10-31 NOTE — Progress Notes (Signed)
I am a little better. ? ?The injection helped the left knee last time. She has less pain, and less swelling.  She has no giving way, no redness.  She does have popping at times.  She has no new trauma. ? ?Left knee has effusion, crepitus and ROM 0 to 110, stable, NV intact, no distal edema. ? ?Encounter Diagnoses  ?Name Primary?  ? Chronic pain of left knee Yes  ? Chronic pain syndrome   ? ?Return in six weeks. ? ?Continue as she is doing. ? ?Call if any problem. ? ?Precautions discussed. ? ?Electronically Signed ?Darreld Mclean, MD ?4/25/20232:45 PM ? ?

## 2021-11-17 ENCOUNTER — Inpatient Hospital Stay (HOSPITAL_COMMUNITY)
Admission: EM | Admit: 2021-11-17 | Discharge: 2021-11-20 | DRG: 872 | Disposition: A | Discharge status: Home / Self Care | Payer: Medicare Other | Attending: Internal Medicine | Admitting: Internal Medicine

## 2021-11-17 ENCOUNTER — Emergency Department (HOSPITAL_COMMUNITY): Payer: Medicare Other

## 2021-11-17 ENCOUNTER — Encounter (HOSPITAL_COMMUNITY): Payer: Self-pay | Admitting: *Deleted

## 2021-11-17 DIAGNOSIS — I129 Hypertensive chronic kidney disease with stage 1 through stage 4 chronic kidney disease, or unspecified chronic kidney disease: Secondary | ICD-10-CM | POA: Diagnosis present

## 2021-11-17 DIAGNOSIS — K529 Noninfective gastroenteritis and colitis, unspecified: Secondary | ICD-10-CM | POA: Diagnosis present

## 2021-11-17 DIAGNOSIS — N1831 Chronic kidney disease, stage 3a: Secondary | ICD-10-CM | POA: Diagnosis present

## 2021-11-17 DIAGNOSIS — E871 Hypo-osmolality and hyponatremia: Secondary | ICD-10-CM | POA: Diagnosis present

## 2021-11-17 DIAGNOSIS — Z886 Allergy status to analgesic agent status: Secondary | ICD-10-CM | POA: Diagnosis not present

## 2021-11-17 DIAGNOSIS — N189 Chronic kidney disease, unspecified: Secondary | ICD-10-CM | POA: Diagnosis present

## 2021-11-17 DIAGNOSIS — Z79890 Hormone replacement therapy (postmenopausal): Secondary | ICD-10-CM

## 2021-11-17 DIAGNOSIS — Z79899 Other long term (current) drug therapy: Secondary | ICD-10-CM

## 2021-11-17 DIAGNOSIS — N1330 Unspecified hydronephrosis: Secondary | ICD-10-CM | POA: Diagnosis present

## 2021-11-17 DIAGNOSIS — M1990 Unspecified osteoarthritis, unspecified site: Secondary | ICD-10-CM | POA: Diagnosis present

## 2021-11-17 DIAGNOSIS — K625 Hemorrhage of anus and rectum: Secondary | ICD-10-CM | POA: Diagnosis present

## 2021-11-17 DIAGNOSIS — I10 Essential (primary) hypertension: Secondary | ICD-10-CM | POA: Diagnosis not present

## 2021-11-17 DIAGNOSIS — K219 Gastro-esophageal reflux disease without esophagitis: Secondary | ICD-10-CM | POA: Diagnosis present

## 2021-11-17 DIAGNOSIS — N179 Acute kidney failure, unspecified: Secondary | ICD-10-CM | POA: Diagnosis present

## 2021-11-17 DIAGNOSIS — R6520 Severe sepsis without septic shock: Secondary | ICD-10-CM | POA: Diagnosis present

## 2021-11-17 DIAGNOSIS — E861 Hypovolemia: Secondary | ICD-10-CM | POA: Diagnosis present

## 2021-11-17 DIAGNOSIS — E079 Disorder of thyroid, unspecified: Secondary | ICD-10-CM

## 2021-11-17 DIAGNOSIS — Z6829 Body mass index (BMI) 29.0-29.9, adult: Secondary | ICD-10-CM

## 2021-11-17 DIAGNOSIS — E663 Overweight: Secondary | ICD-10-CM | POA: Diagnosis present

## 2021-11-17 DIAGNOSIS — A419 Sepsis, unspecified organism: Principal | ICD-10-CM | POA: Diagnosis present

## 2021-11-17 LAB — TYPE AND SCREEN
ABO/RH(D): O POS
Antibody Screen: NEGATIVE

## 2021-11-17 LAB — CBC
HCT: 41.1 % (ref 36.0–46.0)
Hemoglobin: 13.7 g/dL (ref 12.0–15.0)
MCH: 31.8 pg (ref 26.0–34.0)
MCHC: 33.3 g/dL (ref 30.0–36.0)
MCV: 95.4 fL (ref 80.0–100.0)
Platelets: 254 10*3/uL (ref 150–400)
RBC: 4.31 MIL/uL (ref 3.87–5.11)
RDW: 12.8 % (ref 11.5–15.5)
WBC: 25.7 10*3/uL — ABNORMAL HIGH (ref 4.0–10.5)
nRBC: 0 % (ref 0.0–0.2)

## 2021-11-17 LAB — I-STAT CHEM 8, ED
BUN: 33 mg/dL — ABNORMAL HIGH (ref 8–23)
Calcium, Ion: 1.15 mmol/L (ref 1.15–1.40)
Chloride: 104 mmol/L (ref 98–111)
Creatinine, Ser: 3.3 mg/dL — ABNORMAL HIGH (ref 0.44–1.00)
Glucose, Bld: 127 mg/dL — ABNORMAL HIGH (ref 70–99)
HCT: 43 % (ref 36.0–46.0)
Hemoglobin: 14.6 g/dL (ref 12.0–15.0)
Potassium: 4.6 mmol/L (ref 3.5–5.1)
Sodium: 133 mmol/L — ABNORMAL LOW (ref 135–145)
TCO2: 18 mmol/L — ABNORMAL LOW (ref 22–32)

## 2021-11-17 LAB — COMPREHENSIVE METABOLIC PANEL WITH GFR
ALT: 14 U/L (ref 0–44)
AST: 41 U/L (ref 15–41)
Albumin: 3.8 g/dL (ref 3.5–5.0)
Alkaline Phosphatase: 116 U/L (ref 38–126)
Anion gap: 11 (ref 5–15)
BUN: 35 mg/dL — ABNORMAL HIGH (ref 8–23)
CO2: 18 mmol/L — ABNORMAL LOW (ref 22–32)
Calcium: 9.2 mg/dL (ref 8.9–10.3)
Chloride: 105 mmol/L (ref 98–111)
Creatinine, Ser: 3.04 mg/dL — ABNORMAL HIGH (ref 0.44–1.00)
GFR, Estimated: 15 mL/min — ABNORMAL LOW
Glucose, Bld: 132 mg/dL — ABNORMAL HIGH (ref 70–99)
Potassium: 4.6 mmol/L (ref 3.5–5.1)
Sodium: 134 mmol/L — ABNORMAL LOW (ref 135–145)
Total Bilirubin: 0.9 mg/dL (ref 0.3–1.2)
Total Protein: 7.5 g/dL (ref 6.5–8.1)

## 2021-11-17 LAB — LACTIC ACID, PLASMA
Lactic Acid, Venous: 2.4 mmol/L (ref 0.5–1.9)
Lactic Acid, Venous: 1.7 mmol/L (ref 0.5–1.9)

## 2021-11-17 MED ORDER — ALPRAZOLAM 0.5 MG PO TABS
0.5000 mg | ORAL_TABLET | Freq: Two times a day (BID) | ORAL | Status: DC | PRN
  Administered 2021-11-17 – 2021-11-19 (×4): 0.5 mg via ORAL
  Filled 2021-11-17 (×4): qty 1

## 2021-11-17 MED ORDER — PIPERACILLIN-TAZOBACTAM 3.375 G IVPB 30 MIN
3.3750 g | Freq: Once | INTRAVENOUS | Status: AC
  Administered 2021-11-17: 3.375 g via INTRAVENOUS
  Filled 2021-11-17: qty 50

## 2021-11-17 MED ORDER — PANTOPRAZOLE SODIUM 40 MG PO TBEC
40.0000 mg | DELAYED_RELEASE_TABLET | Freq: Every day | ORAL | Status: DC
Start: 2021-11-18 — End: 2021-11-20
  Administered 2021-11-18 – 2021-11-20 (×3): 40 mg via ORAL
  Filled 2021-11-17 (×3): qty 1

## 2021-11-17 MED ORDER — ACETAMINOPHEN 325 MG PO TABS: 650.0000 mg | ORAL_TABLET | Freq: Four times a day (QID) | ORAL | Status: DC | PRN

## 2021-11-17 MED ORDER — PIPERACILLIN-TAZOBACTAM 3.375 G IVPB
3.3750 g | Freq: Two times a day (BID) | INTRAVENOUS | Status: DC
  Administered 2021-11-18: 3.375 g via INTRAVENOUS
  Filled 2021-11-17: qty 50

## 2021-11-17 MED ORDER — LEVOTHYROXINE SODIUM 50 MCG PO TABS
75.0000 ug | ORAL_TABLET | Freq: Every day | ORAL | Status: DC
  Administered 2021-11-18: 75 ug via ORAL
  Filled 2021-11-17: qty 2

## 2021-11-17 MED ORDER — SODIUM CHLORIDE 0.9 % IV BOLUS
1000.0000 mL | Freq: Once | INTRAVENOUS | Status: AC
  Administered 2021-11-17: 1000 mL via INTRAVENOUS

## 2021-11-17 MED ORDER — ENOXAPARIN SODIUM 30 MG/0.3ML IJ SOSY
30.0000 mg | PREFILLED_SYRINGE | INTRAMUSCULAR | Status: DC
  Administered 2021-11-18 – 2021-11-19 (×2): 30 mg via SUBCUTANEOUS
  Filled 2021-11-17 (×2): qty 0.3

## 2021-11-17 MED ORDER — HYDROMORPHONE HCL 1 MG/ML IJ SOLN
0.5000 mg | INTRAMUSCULAR | Status: DC | PRN
  Administered 2021-11-19: 0.5 mg via INTRAVENOUS
  Filled 2021-11-17: qty 0.5

## 2021-11-17 MED ORDER — ONDANSETRON HCL 4 MG/2ML IJ SOLN: 4.0000 mg | Freq: Four times a day (QID) | INTRAMUSCULAR | Status: DC | PRN

## 2021-11-17 MED ORDER — CYCLOBENZAPRINE HCL 10 MG PO TABS
5.0000 mg | ORAL_TABLET | Freq: Three times a day (TID) | ORAL | Status: DC | PRN
  Administered 2021-11-18: 5 mg via ORAL
  Filled 2021-11-17: qty 1

## 2021-11-17 MED ORDER — ACETAMINOPHEN 650 MG RE SUPP: 650.0000 mg | Freq: Four times a day (QID) | RECTAL | Status: DC | PRN

## 2021-11-17 MED ORDER — SODIUM CHLORIDE 0.9 % IV BOLUS
2000.0000 mL | Freq: Once | INTRAVENOUS | Status: AC
  Administered 2021-11-17: 2000 mL via INTRAVENOUS

## 2021-11-17 MED ORDER — DEXTROSE-NACL 5-0.9 % IV SOLN: INTRAVENOUS | Status: DC

## 2021-11-17 MED ORDER — ONDANSETRON HCL 4 MG PO TABS: 4.0000 mg | ORAL_TABLET | Freq: Four times a day (QID) | ORAL | Status: DC | PRN

## 2021-11-17 MED ORDER — ZOLPIDEM TARTRATE 5 MG PO TABS: 10.0000 mg | ORAL_TABLET | Freq: Every evening | ORAL | Status: DC | PRN

## 2021-11-17 MED ORDER — SODIUM CHLORIDE 0.9 % IV BOLUS
1000.0000 mL | Freq: Once | INTRAVENOUS | Status: AC
Start: 2021-11-17 — End: 2021-11-17
  Administered 2021-11-17: 1000 mL via INTRAVENOUS

## 2021-11-17 NOTE — ED Triage Notes (Signed)
Pt in with family reports rectal bleeding onset last night with bright red blood in stool per pt, pt here also reporting eval for L foot numbness, pt able to flex foot, pt states, "I cannot walk and my son had to carry me down." Pt noted foot numbness onset last night, pt denies blurred vision, unilateral weakness, no slurred speech present, A&O x4 ?

## 2021-11-17 NOTE — ED Notes (Signed)
Pt BP 70/44. MD made aware. Verbal order to start 2L 0/9% NaCl ?

## 2021-11-17 NOTE — Assessment & Plan Note (Addendum)
-  Patient with hypotension due to volume depletion. ?-Continue to closely follow vital signs. ?-Safe to resume home antihypertensive agents at adjusted dose. ?

## 2021-11-17 NOTE — ED Provider Notes (Signed)
?[facility] ?Provider Note ? ? ?CSN: [id number] ?Arrival date & time: 11/17/21  1832 ? ?  ? ?History ? ?Chief Complaint  ?Patient presents with  ? Rectal Bleeding  ? ? ?Kim Schultz is a 81 y.o. female. ? ?Patient states that she has been vomiting and having diarrhea for 2 days.  She also had some blood in her diarrhea.  Patient has a history of GERD. ? ?The history is provided by a relative. No language interpreter was used.  ?Rectal Bleeding ?Quality:  Unable to specify ?Timing:  Intermittent ?Chronicity:  New ?Context: not anal fissures   ?Similar prior episodes: no   ?Relieved by:  Nothing ?Worsened by:  Nothing ?Associated symptoms: abdominal pain   ?Associated symptoms: no fever and no vomiting   ?Risk factors: no anticoagulant use   ? ?  ? ?Home Medications ?Prior to Admission medications   ?Medication Sig Start Date End Date Taking? Authorizing Provider  ?ALPRAZolam (XANAX) 0.5 MG tablet Take 0.5 mg by mouth 2 (two) times daily as needed for anxiety.    [provider]  ?cyclobenzaprine (FLEXERIL) 10 MG tablet One tablet twice a day as needed for spasm 10/10/21   Darreld Mclean, MD  ?dicyclomine (BENTYL) 10 MG capsule TAKE ONE CAPSULE BY MOUTH 4 TIMES DAILY-BEFORE MEALS AND AT BEDTIME. ?Patient taking differently: 10 mg as needed. 11/24/19   Darreld Mclean, MD  ?HYDROcodone-acetaminophen (NORCO/VICODIN) 5-325 MG tablet One tablet by mouth every six hours as needed for pain. 10/18/20   Darreld Mclean, MD  ?levothyroxine (SYNTHROID) 75 MCG tablet  10/08/19   [provider]  ?lisinopril (PRINIVIL,ZESTRIL) 20 MG tablet  05/01/18   [provider]  ?omeprazole (PRILOSEC) 20 MG capsule Take 1 capsule (20 mg total) by mouth daily. 03/27/19   Vassie Loll, MD  ?zolpidem (AMBIEN) 10 MG tablet Take 10 mg by mouth at bedtime as needed for sleep.    [provider]  ?   ? ?Allergies    ?Naprosyn [naproxen]   ? ?Review of Systems   ?Review of Systems   ?Constitutional:  Negative for chills and fever.  ?HENT:  Negative for ear pain and sore throat.   ?Eyes:  Negative for pain and visual disturbance.  ?Respiratory:  Negative for cough and shortness of breath.   ?Cardiovascular:  Negative for chest pain and palpitations.  ?Gastrointestinal:  Positive for abdominal pain, diarrhea and hematochezia. Negative for vomiting.  ?Genitourinary:  Negative for dysuria and hematuria.  ?Musculoskeletal:  Negative for arthralgias and back pain.  ?Skin:  Negative for color change and rash.  ?Neurological:  Negative for seizures and syncope.  ?All other systems reviewed and are negative. ? ?Physical Exam ?Updated Vital Signs ?BP 108/80   Pulse 96   Temp 98.7 ?F (37.1 ?C) (Oral)   Resp 18   SpO2 98%  ?Physical Exam ?Vitals and nursing note reviewed.  ?Constitutional:   ?   Appearance: She is well-developed.  ?HENT:  ?   Head: Normocephalic.  ?   Nose: Nose normal.  ?Eyes:  ?   General: No scleral icterus. ?   Conjunctiva/sclera: Conjunctivae normal.  ?Neck:  ?   Thyroid: No thyromegaly.  ?Cardiovascular:  ?   Rate and Rhythm: Normal rate and regular rhythm.  ?   Heart sounds: No murmur heard. ?  No friction rub. No gallop.  ?Pulmonary:  ?   Breath sounds: No stridor. No wheezing or rales.  ?Chest:  ?   Chest wall: No  tenderness.  ?Abdominal:  ?   General: There is no distension.  ?   Tenderness: There is abdominal tenderness. There is no rebound.  ?Musculoskeletal:     ?   General: Normal range of motion.  ?   Cervical back: Neck supple.  ?Lymphadenopathy:  ?   Cervical: No cervical adenopathy.  ?Skin: ?   Findings: No erythema or rash.  ?Neurological:  ?   Mental Status: She is alert and oriented to person, place, and time.  ?   Motor: No abnormal muscle tone.  ?   Coordination: Coordination normal.  ?Psychiatric:     ?   Behavior: Behavior normal.  ? ? ?ED Results / Procedures / Treatments   ?Labs ?(all labs ordered are listed, but only abnormal results are displayed) ?Labs  Reviewed  ?COMPREHENSIVE METABOLIC PANEL - Abnormal; Notable for the following components:  ?    Result Value  ? Sodium 134 (*)   ? CO2 18 (*)   ? Glucose, Bld 132 (*)   ? BUN 35 (*)   ? Creatinine, Ser 3.04 (*)   ? GFR, Estimated 15 (*)   ? All other components within normal limits  ?CBC - Abnormal; Notable for the following components:  ? WBC 25.7 (*)   ? All other components within normal limits  ?POC OCCULT BLOOD, ED - Abnormal  ?I-STAT CHEM 8, ED - Abnormal; Notable for the following components:  ? Sodium 133 (*)   ? BUN 33 (*)   ? Creatinine, Ser 3.30 (*)   ? Glucose, Bld 127 (*)   ? TCO2 18 (*)   ? All other components within normal limits  ?CULTURE, BLOOD (ROUTINE X 2)  ?CULTURE, BLOOD (ROUTINE X 2)  ?C DIFFICILE QUICK SCREEN W PCR REFLEX    ?GASTROINTESTINAL PANEL BY PCR, STOOL (REPLACES STOOL CULTURE)  ?LACTIC ACID, PLASMA  ?LACTIC ACID, PLASMA  ?TYPE AND SCREEN  ? ? ?EKG ?None ? ?Radiology ?CT ABDOMEN PELVIS WO CONTRAST ? ?Result Date: 11/17/2021 ?CLINICAL DATA:  Rectal bleeding. EXAM: CT ABDOMEN AND PELVIS WITHOUT CONTRAST TECHNIQUE: Multidetector CT imaging of the abdomen and pelvis was performed following the standard protocol without IV contrast. RADIATION DOSE REDUCTION: This exam was performed according to the departmental dose-optimization program which includes automated exposure control, adjustment of the mA and/or kV according to patient size and/or use of iterative reconstruction technique. COMPARISON:  None Available. FINDINGS: Lower chest: No acute abnormality. Hepatobiliary: No focal liver abnormality is seen. No gallstones, gallbladder wall thickening, or biliary dilatation. Pancreas: There is diffuse fatty infiltration of the pancreatic parenchyma. No pancreatic ductal dilatation or surrounding inflammatory changes. Spleen: Normal in size without focal abnormality. Adrenals/Urinary Tract: Adrenal glands are unremarkable. Left kidney is atrophic in appearance. The right kidney is normal in  size, without renal calculi or focal lesions. There is mild right-sided hydronephrosis. Bladder is unremarkable. Stomach/Bowel: Stomach is within normal limits. The appendix is not clearly identified. No evidence of dilatation. Mild diffuse thickening of the descending and sigmoid colon is seen with a mild amount of pericolonic inflammatory fat stranding. Vascular/Lymphatic: Aortic atherosclerosis. No enlarged abdominal or pelvic lymph nodes. Reproductive: Status post hysterectomy. No adnexal masses. Other: No abdominal wall hernia or abnormality. No abdominopelvic ascites. Musculoskeletal: Multilevel degenerative changes seen throughout the lumbar spine. IMPRESSION: 1. Mild colitis involving the descending and sigmoid colon. 2. Mild right-sided hydronephrosis without evidence of renal calculi. 3. Atrophic left kidney. 4. Aortic atherosclerosis. Aortic Atherosclerosis (ICD10-I70.0). Electronically Signed   By: Waylan Rocherhaddeus  [place] M.D.   On: 11/17/2021 21:12  ? ?DG Chest Port 1 View ? ?Result Date: 11/17/2021 ?CLINICAL DATA:  Shortness of breath and rectal bleeding. EXAM: PORTABLE CHEST 1 VIEW COMPARISON:  July 05, 2016 FINDINGS: The heart size and mediastinal contours are within normal limits. Mild linear atelectasis is seen within the left lung base. There is no evidence of acute infiltrate, pleural effusion or pneumothorax. The visualized skeletal structures are unremarkable. IMPRESSION: No acute cardiopulmonary disease. Electronically Signed   By: Aram Candela M.D.   On: 11/17/2021 21:13   ? ?Procedures ?Procedures  ? ? ?Medications Ordered in ED ?Medications  ?sodium chloride 0.9 % bolus 2,000 mL (0 mLs Intravenous Stopped 11/17/21 2029)  ?piperacillin-tazobactam (ZOSYN) IVPB 3.375 g (0 g Intravenous Stopped 11/17/21 2056)  ?sodium chloride 0.9 % bolus 1,000 mL (1,000 mLs Intravenous New Bag/Given 11/17/21 2124)  ? ? ?ED Course/ Medical Decision Making/ A&P ? CRITICAL CARE ?Performed by: Bethann Berkshire ?Total  critical care time: 40 minutes ?Critical care time was exclusive of separately billable procedures and treating other patients. ?Critical care was necessary to treat or prevent imminent or life-threatening deterioration. ?C

## 2021-11-17 NOTE — ED Notes (Signed)
Occult positive

## 2021-11-17 NOTE — Assessment & Plan Note (Addendum)
-  Acute colitis, affecting descending and sigmoid colon. ?-Patient met criteria for severe sepsis on presentation with identified source of infection, elevated WBCs, elevated lactic acid, elevated respiratory rate and acute kidney injury as part of organ dysfunction. ?-After fluid resuscitation and IV antibiotics condition has stabilized; sepsis features resolved and patient tolerating diet prior to discharge. ?-She will complete treatment using oral Augmentin and will follow-up with PCP in 10 days. ?-Continue as needed antiemetics and the use of Florastor as prescribed. ? ? ?

## 2021-11-17 NOTE — Assessment & Plan Note (Addendum)
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 ?-Patient advised to maintain adequate hydration and to follow heart healthy diet. ?-Follow renal function trend with repeat basic metabolic panel at follow-up visit. ? ? ?

## 2021-11-17 NOTE — ED Notes (Signed)
[place], MD at bedside 

## 2021-11-17 NOTE — H&P (Addendum)
?History and Physical  ? ? ?Patient: Kim Schultz [id number] DOB: Jan 31, 1941 ?DOA: 11/17/2021 ?DOS: the patient was seen and examined on 11/17/2021 ?PCP: Beverly Milch, NP  ?Patient coming from: Home ? ?Chief Complaint:  ?Chief Complaint  ?Patient presents with  ? Rectal Bleeding  ? ?HPI: Kim Schultz is a 81 y.o. female with medical history significant of hypertension and GERD who presented with nausea, vomiting, diarrhea and abdominal pain.  ?Reported acute onset of nausea and vomiting last night, she had profuse diarrhea for about 4 hours, feeling very weak and deconditioned, positive paresthesias on her feet and difficulty ambulating.  ?During the course of today she continue to have intermittent rectal bleeding, bright red with no clots.  ?Abdominal pain colicky in nature, intermittent, 10/10 in intensity with no improving or worsening factors.  ?She has not taken her home medications but she did yesterday.  ? ?Reports intermittent nausea and vomiting self resolved for over one year, but no diarrhea like this time.  ? ?Denies any sick contacts on recent change in eating patterns, no recent travel. ?No fever of chills.   ? ?  ?Review of Systems: As mentioned in the history of present illness. All other systems reviewed and are negative. ?Past Medical History:  ?Diagnosis Date  ? Arthritis   ? GERD (gastroesophageal reflux disease)   ? Hypertension   ? Thyroid disease   ? ?Past Surgical History:  ?Procedure Laterality Date  ? ABDOMINAL HYSTERECTOMY    ? ELBOW SURGERY Left   ? TONSILLECTOMY    ? ?Social History:  reports that she has never smoked. She has never used smokeless tobacco. She reports that she does not drink alcohol and does not use drugs. ? ?Allergies  ?Allergen Reactions  ? Naprosyn [Naproxen] Other (See Comments)  ?  Intolerance ?  ? ? ?Family History  ?Family history unknown: Yes  ? ? ?Prior to Admission medications   ?Medication Sig Start Date End Date Taking? Authorizing Provider   ?ALPRAZolam (XANAX) 0.5 MG tablet Take 0.5 mg by mouth 2 (two) times daily as needed for anxiety.    [provider]  ?cyclobenzaprine (FLEXERIL) 10 MG tablet One tablet twice a day as needed for spasm 10/10/21   Sanjuana Kava, MD  ?dicyclomine (BENTYL) 10 MG capsule TAKE ONE CAPSULE BY MOUTH 4 TIMES DAILY-BEFORE MEALS AND AT BEDTIME. ?Patient taking differently: 10 mg as needed. 11/24/19   Sanjuana Kava, MD  ?HYDROcodone-acetaminophen (NORCO/VICODIN) 5-325 MG tablet One tablet by mouth every six hours as needed for pain. 10/18/20   Sanjuana Kava, MD  ?levothyroxine (SYNTHROID) 75 MCG tablet  10/08/19   [provider]  ?levothyroxine (SYNTHROID) 88 MCG tablet Take 88 mcg by mouth every morning. 10/04/21   [provider]  ?lisinopril (PRINIVIL,ZESTRIL) 20 MG tablet  05/01/18   [provider]  ?lisinopril (ZESTRIL) 40 MG tablet Take 40 mg by mouth daily. 10/04/21   [provider]  ?metoprolol tartrate (LOPRESSOR) 25 MG tablet Take 25 mg by mouth 2 (two) times daily. 08/05/21   [provider]  ?omeprazole (PRILOSEC) 20 MG capsule Take 1 capsule (20 mg total) by mouth daily. 03/27/19   Barton Dubois, MD  ?zolpidem (AMBIEN) 10 MG tablet Take 10 mg by mouth at bedtime as needed for sleep.    [provider]  ? ? ?Physical Exam: ?Vitals:  ? 11/17/21 2115 11/17/21 2120 11/17/21 2130 11/17/21 2140  ?BP: (!) 83/58 (!) 89/40 108/80   ?Pulse: 93 95 96  93  ?Resp: 14 20 18  (!) 21  ?Temp:      ?TempSrc:      ?SpO2: 95% 97% 98% 97%  ? ?Neurology awake and alert, deconditioned and ill looking appearing  ?ENT with mild pallor but no icterus ?Cardiovascular with S1 and S2 present and rhythmic with no gallops, rubs or murmurs ?Respiratory with no rales, rhonchi or wheezing ?Abdomen protuberant and tender to deep palpation, no rebound or guarding ?No lower extremity edema ?No rashes  ?Data Reviewed: ? ? ? ?81 yo female with the past medical history of GERD and hypertension  who presented with severe and profuse diarrhea, associated with abdominal pain, nausea, vomiting and hematochezia. Her blood pressure is low. She looks pale and deconditioned, HR is in the 90's. Abdomen is not distended but tender to deep palpation with no peritoneal signs.  ? ?Na 134, K 4,6 Cl 105, bicarbonate 18, glucose 132, bun 35 cr 3,0 to 3.3 ?Lactic acid 2,4  ?Wbc 25,7, hgb 13,7 plt 254  ?Lactic acid 2,4  ? ?CT abdomen and pelvis with mild colitis involving the descending and sigmoid colon. Mid right sided hydronephrosis without evidence of renal calculi. Atrophic left kidney.  ? ?Chest radiograph with no infiltrates.  ? ?Patient is being admitted to the hospital with the working diagnosis of colitis, complicated with sepsis, present on admission.  ? ? ?Assessment and Plan: ?* Colitis ?Acute colitis, at the descending and sigmoid colon. ?Patient with severe sepsis present on admission. ? ?Patient clinically hypovolemic.  ?Plan to continue volume resuscitation with isotonic saline at per hr x 1lt and then continue at 100 ml per hr. ?Re-bolus if needed.  ?Antibiotic therapy with Zosyn IV ?Continue pain control with IV hydromorphone.  ?Follow up cell count in am.  ?Stool studies, including C diff.  ? ? ?Acute kidney injury superimposed on chronic kidney disease (HCC) ?CKD stage 3a with base cr at 1,5 (old records personally reviewed). ?Hyponatremia.  ?Patient with lactic acidosis, and non anion gap metabolic acidosis. ? ?Plan to continue volume resuscitation with isotonic saline. ?Follow up renal function in am, avoid hypotension and nephrotoxic medications ?If worsening acidosis, will change fluids to bicarbonate infusion. (significant GI bicarb loss).  ? ? ?Hypertension ?Patient with hypotension due to volume depletion. ?At home on lisinopril and metoprolol. ?Continue to hold on antihypertensive medications.  ?Continue close blood pressure monitoring.  ? ?Thyroid disease ?Continue with levothyroxine.   ? ? ? ? ? Advance Care Planning:   Code Status: Prior  ? ?Consults: none  ? ?Family Communication: I spoke with patient's daughter at the bedside, we talked in detail about patient's condition, plan of care and prognosis and all questions were addressed. ? ? ?Severity of Illness: ?The appropriate patient status for this patient is INPATIENT. Inpatient status is judged to be reasonable and necessary in order to provide the required intensity of service to ensure the patient's safety. The patient's presenting symptoms, physical exam findings, and initial radiographic and laboratory data in the context of their chronic comorbidities is felt to place them at high risk for further clinical deterioration. Furthermore, it is not anticipated that the patient will be medically stable for discharge from the hospital within 2 midnights of admission.  ? ?* I certify that at the point of admission it is my clinical judgment that the patient will require inpatient hospital care spanning beyond 2 midnights from the point of admission due to high intensity of service, high risk for further deterioration and high  frequency of surveillance required.* ? ?Author: ?Tawni Millers, MD ?11/17/2021 9:55 PM ? ?For on call review [email or web address].  ?

## 2021-11-17 NOTE — ED Triage Notes (Signed)
Pt reports vomiting x5-10 episodes within the last 24 hours ?

## 2021-11-17 NOTE — Assessment & Plan Note (Addendum)
-  Continue tx with levothyroxine.  ?-Outpatient follow-up of her thyroid panel recommended. ?

## 2021-11-17 NOTE — Progress Notes (Signed)
Pharmacy Antibiotic Note ? ?Kim Schultz is a 81 y.o. female admitted on 11/17/2021 with  colitis .  Pharmacy has been consulted for Zosyn dosing.  Of note, pt w/ AKI, baseline SCr 1.5, now 3. ? ?Plan: ?Zosyn 3.375g IV Q12H (4-hour infusion); inc to [facility] if renal function improves. ? ?Height: 5\' 7"  (170.2 cm) ?Weight: 84.4 kg (186 lb) ?IBW/kg (Calculated) : 61.6 ? ?Temp (24hrs), Avg:98.7 ?F (37.1 ?C), Min:98.7 ?F (37.1 ?C), Max:98.7 ?F (37.1 ?C) ? ?Recent Labs  ?Lab 11/17/21 ?1852 11/17/21 ?1923 11/17/21 ?2006 11/17/21 ?2158  ?WBC 25.7*  --   --   --   ?CREATININE 3.04* 3.30*  --   --   ?LATICACIDVEN  --   --  2.4* 1.7  ?  ?Estimated Creatinine Clearance: 15.4 mL/min (A) (by C-G formula based on SCr of 3.3 mg/dL (H)).   ? ?Allergies  ?Allergen Reactions  ? Naprosyn [Naproxen] Other (See Comments)  ?  Intolerance ?  ? ? ? ?Thank you for allowing pharmacy to be a part of this patient?s care. ? ?2159, PharmD, BCPS  ?11/17/2021 11:20 PM ? ?

## 2021-11-18 DIAGNOSIS — K219 Gastro-esophageal reflux disease without esophagitis: Secondary | ICD-10-CM | POA: Diagnosis not present

## 2021-11-18 DIAGNOSIS — K529 Noninfective gastroenteritis and colitis, unspecified: Secondary | ICD-10-CM | POA: Diagnosis not present

## 2021-11-18 DIAGNOSIS — N179 Acute kidney failure, unspecified: Secondary | ICD-10-CM | POA: Diagnosis not present

## 2021-11-18 DIAGNOSIS — I10 Essential (primary) hypertension: Secondary | ICD-10-CM | POA: Diagnosis not present

## 2021-11-18 LAB — BASIC METABOLIC PANEL
Anion gap: 4 — ABNORMAL LOW (ref 5–15)
BUN: 31 mg/dL — ABNORMAL HIGH (ref 8–23)
CO2: 20 mmol/L — ABNORMAL LOW (ref 22–32)
Calcium: 8 mg/dL — ABNORMAL LOW (ref 8.9–10.3)
Chloride: 114 mmol/L — ABNORMAL HIGH (ref 98–111)
Creatinine, Ser: 2.19 mg/dL — ABNORMAL HIGH (ref 0.44–1.00)
GFR, Estimated: 22 mL/min — ABNORMAL LOW (ref >60)
Glucose, Bld: 111 mg/dL — ABNORMAL HIGH (ref 70–99)
Potassium: 4.2 mmol/L (ref 3.5–5.1)
Sodium: 138 mmol/L (ref 135–145)

## 2021-11-18 LAB — CBC
HCT: 35.6 % — ABNORMAL LOW (ref 36.0–46.0)
Hemoglobin: 11.4 g/dL — ABNORMAL LOW (ref 12.0–15.0)
MCH: 31.8 pg (ref 26.0–34.0)
MCHC: 32 g/dL (ref 30.0–36.0)
MCV: 99.4 fL (ref 80.0–100.0)
Platelets: 163 10*3/uL (ref 150–400)
RBC: 3.58 MIL/uL — ABNORMAL LOW (ref 3.87–5.11)
RDW: 13.1 % (ref 11.5–15.5)
WBC: 17.9 10*3/uL — ABNORMAL HIGH (ref 4.0–10.5)
nRBC: 0 % (ref 0.0–0.2)

## 2021-11-18 MED ORDER — LEVOTHYROXINE SODIUM 88 MCG PO TABS
88.0000 ug | ORAL_TABLET | Freq: Every day | ORAL | Status: DC
  Administered 2021-11-19 – 2021-11-20 (×2): 88 ug via ORAL
  Filled 2021-11-18 (×2): qty 1

## 2021-11-18 MED ORDER — PIPERACILLIN-TAZOBACTAM 3.375 G IVPB
3.3750 g | Freq: Three times a day (TID) | INTRAVENOUS | Status: DC
  Administered 2021-11-18 – 2021-11-20 (×6): 3.375 g via INTRAVENOUS
  Filled 2021-11-18 (×6): qty 50

## 2021-11-18 NOTE — Progress Notes (Incomplete)
Pt stating that she is having a lot of pressure. Bladder scan performed .Marland Kitchen  ?

## 2021-11-18 NOTE — Assessment & Plan Note (Signed)
continue PPI

## 2021-11-18 NOTE — Progress Notes (Signed)
?Progress Note ? ? ?Patient: Kim Schultz QZR:[id number] DOB: 17-Feb-1941 DOA: 11/17/2021     1 ?DOS: the patient was seen and examined on 11/18/2021 ?  ?Brief hospital course: ?As per H&P written by Dr. Cathlean Sauer on 11/17/2021 ?Kim Schultz is a 81 y.o. female with medical history significant of hypertension and GERD who presented with nausea, vomiting, diarrhea and abdominal pain.  ?Reported acute onset of nausea and vomiting last night, she had profuse diarrhea for about 4 hours, feeling very weak and deconditioned, positive paresthesias on her feet and difficulty ambulating.  ?During the course of today she continue to have intermittent rectal bleeding, bright red with no clots.  ?Abdominal pain colicky in nature, intermittent, 10/10 in intensity with no improving or worsening factors.  ?She has not taken her home medications but she did yesterday.  ?  ?Reports intermittent nausea and vomiting self resolved for over one year, but no diarrhea like this time.  ?  ?Denies any sick contacts on recent change in eating patterns, no recent travel. ?No fever of chills.   ?  ?Assessment and Plan: ?* Colitis ?-Acute colitis, affecting descending and sigmoid colon. ?-Patient met criteria for severe sepsis on presentation with identified source of infection, elevated WBCs, elevated lactic acid, elevated respiratory rate and acute kidney injury as part of organ dysfunction. ?-Continue fluid resuscitation ?-Continue current antibiotic therapy ?-Follow culture result ?-Continue supportive care ?-As needed antiemetics and analgesics will be provided. ? ? ? ?Acute kidney injury superimposed on chronic kidney disease ([place]) ?-CKD stage 3a with base cr around 1.5 ?-In the setting of GI losses, decreased oral intake and prerenal azotemia ?-Continue aggressive fluid resuscitation; minimize/avoid the use of nephrotoxic agents and avoid the use of contrast/hypotension. ?-Follow renal function trend. ? ? ? ?Hypertension ?-Patient with  hypotension due to volume depletion. ?-Close monitoring of patient vital ?-Hold antihypertensive agents at this point. ? ?Thyroid disease ?-Continue tx with levothyroxine.  ? ?GERD (gastroesophageal reflux disease) ?-continue PPI. ? ?Overweight: ?-Body mass index is 29.13 kg/m?. ?-Low calorie diet, portion control and increase physical activity discussed with patient. ? ? ?Subjective:  ?Afebrile, no chest pain, no focal weakness.  Still feeling nauseous and complaining of abdominal pain.  Ill looking. ? ?Physical Exam: ?Vitals:  ? 11/18/21 0600 11/18/21 0630 11/18/21 0800 11/18/21 0816  ?BP: (!) 107/51 122/61 (!) 152/82   ?Pulse: 82 81 93   ?Resp: (!) 23 14 19    ?Temp:    98.6 ?F (37 ?C)  ?TempSrc:    Oral  ?SpO2: 98% 97% 100%   ?Weight:      ?Height:      ? ?General exam: Alert, awake, oriented x 3; ill looking, reporting abdominal tenderness.  No chest pain, currently afebrile. ?Respiratory system: Positive scattered rhonchi.  Respiratory effort normal.  No using accessory muscle. ?Cardiovascular system:RRR.  No rubs, no gallops, no JVD. ?Gastrointestinal system: Abdomen is mildly distended, soft, no guarding, positive to tenderness diffuse/vague but more localized to left quadrant.  Positive bowel sounds.   ?Central nervous system: Alert and oriented. No focal neurological deficits. ?Extremities: No cyanosis clubbing. ?Skin: No petechiae. ?Psychiatry: Judgement and insight appear normal. Mood & affect appropriate.  ? ?Data Reviewed: ?CBC demonstrating WBCs of 17.9, hemoglobin 11.4 and platelet count 163 K ?Basic metabolic panel: ? ?Family Communication: No family at bedside. ? ?Disposition: ?Status is: Inpatient ?Remains inpatient appropriate because: Continue IV fluid resuscitation, follow renal function, continue current antibiotics and assess clinical response.  Replete electrolytes as needed.  Patient requiring IV antibiotic therapy for severe sepsis due to colitis. ? ? Planned Discharge Destination:  Home ? ? ? ?Author: ?Barton Dubois, MD ?11/18/2021 8:52 AM ? ?For on call review [email or web address].  ?

## 2021-11-18 NOTE — ED Notes (Signed)
Called pt's daughter, Quantina Cosco,  and updated her on plan of care ?

## 2021-11-18 NOTE — Progress Notes (Signed)
?  Transition of Care (TOC) Screening Note ? ? ?Patient Details  ?Name: Kim Schultz ?Date of Birth: 1941-03-31 ? ? ?Transition of Care (TOC) CM/SW Contact:    ?Demarie Uhlig D, LCSW ?Phone Number: ?11/18/2021, 8:59 AM ? ? ? ?Transition of Care Department [facility]) has reviewed patient and no TOC needs have been identified at this time. We will continue to monitor patient advancement through interdisciplinary progression rounds. If new patient transition needs arise, please place a TOC consult. ?  ?

## 2021-11-19 DIAGNOSIS — K219 Gastro-esophageal reflux disease without esophagitis: Secondary | ICD-10-CM | POA: Diagnosis not present

## 2021-11-19 DIAGNOSIS — N179 Acute kidney failure, unspecified: Secondary | ICD-10-CM | POA: Diagnosis not present

## 2021-11-19 DIAGNOSIS — I10 Essential (primary) hypertension: Secondary | ICD-10-CM | POA: Diagnosis not present

## 2021-11-19 DIAGNOSIS — K529 Noninfective gastroenteritis and colitis, unspecified: Secondary | ICD-10-CM | POA: Diagnosis not present

## 2021-11-19 LAB — CBC
HCT: 32 % — ABNORMAL LOW (ref 36.0–46.0)
Hemoglobin: 10.2 g/dL — ABNORMAL LOW (ref 12.0–15.0)
MCH: 31.9 pg (ref 26.0–34.0)
MCHC: 31.9 g/dL (ref 30.0–36.0)
MCV: 100 fL (ref 80.0–100.0)
Platelets: 139 10*3/uL — ABNORMAL LOW (ref 150–400)
RBC: 3.2 MIL/uL — ABNORMAL LOW (ref 3.87–5.11)
RDW: 13.1 % (ref 11.5–15.5)
WBC: 7.9 10*3/uL (ref 4.0–10.5)
nRBC: 0 % (ref 0.0–0.2)

## 2021-11-19 LAB — BASIC METABOLIC PANEL
Anion gap: 3 — ABNORMAL LOW (ref 5–15)
BUN: 15 mg/dL (ref 8–23)
CO2: 20 mmol/L — ABNORMAL LOW (ref 22–32)
Calcium: 7.8 mg/dL — ABNORMAL LOW (ref 8.9–10.3)
Chloride: 116 mmol/L — ABNORMAL HIGH (ref 98–111)
Creatinine, Ser: 1.59 mg/dL — ABNORMAL HIGH (ref 0.44–1.00)
GFR, Estimated: 33 mL/min — ABNORMAL LOW (ref >60)
Glucose, Bld: 119 mg/dL — ABNORMAL HIGH (ref 70–99)
Potassium: 3.7 mmol/L (ref 3.5–5.1)
Sodium: 139 mmol/L (ref 135–145)

## 2021-11-19 MED ORDER — SACCHAROMYCES BOULARDII 250 MG PO CAPS
250.0000 mg | ORAL_CAPSULE | Freq: Two times a day (BID) | ORAL | Status: DC
  Administered 2021-11-19 – 2021-11-20 (×2): 250 mg via ORAL
  Filled 2021-11-19 (×2): qty 1

## 2021-11-19 MED ORDER — ENOXAPARIN SODIUM 40 MG/0.4ML IJ SOSY
40.0000 mg | PREFILLED_SYRINGE | INTRAMUSCULAR | Status: DC
  Administered 2021-11-20: 40 mg via SUBCUTANEOUS
  Filled 2021-11-19: qty 0.4

## 2021-11-19 NOTE — Progress Notes (Signed)
?Progress Note ? ? ?Patient: Kim Schultz DOB: 1941-05-24 DOA: 11/17/2021     2 ?DOS: the patient was seen and examined on 11/19/2021 ?  ?Brief hospital course: ?As per H&P written by Dr. Cathlean Sauer on 11/17/2021 ?Kim Schultz is a 81 y.o. female with medical history significant of hypertension and GERD who presented with nausea, vomiting, diarrhea and abdominal pain.  ?Reported acute onset of nausea and vomiting last night, she had profuse diarrhea for about 4 hours, feeling very weak and deconditioned, positive paresthesias on her feet and difficulty ambulating.  ?During the course of today she continue to have intermittent rectal bleeding, bright red with no clots.  ?Abdominal pain colicky in nature, intermittent, 10/10 in intensity with no improving or worsening factors.  ?She has not taken her home medications but she did yesterday.  ?  ?Reports intermittent nausea and vomiting self resolved for over one year, but no diarrhea like this time.  ?  ?Denies any sick contacts on recent change in eating patterns, no recent travel. ?No fever of chills.   ?  ?Assessment and Plan: ?* Colitis ?-Acute colitis, affecting descending and sigmoid colon. ?-Patient met criteria for severe sepsis on presentation with identified source of infection, elevated WBCs, elevated lactic acid, elevated respiratory rate and acute kidney injury as part of organ dysfunction. ?-Continue fluid resuscitation ?-Continue current antibiotic therapy ?-Follow culture result ?-Continue supportive care ?-As needed antiemetics and analgesics will be provided. ? ? ? ?Acute kidney injury superimposed on chronic kidney disease ([place]) ?-CKD stage 3a with base cr around 1.5 ?-In the setting of GI losses, decreased oral intake and prerenal azotemia ?-Continue aggressive fluid resuscitation; minimize/avoid the use of nephrotoxic agents and avoid the use of contrast/hypotension. ?-Follow renal function trend. ? ? ? ?Hypertension ?-Patient with  hypotension due to volume depletion. ?-Close monitoring of patient vital ?-Hold antihypertensive agents at this point. ? ?Thyroid disease ?-Continue tx with levothyroxine.  ? ?GERD (gastroesophageal reflux disease) ?-continue PPI. ? ?Overweight: ?-Body mass index is 29.13 kg/m?. ?-Low calorie diet, portion control and increase physical activity discussed with patient. ? ? ?Subjective:  ?In no acute distress; reporting feeling better.  Still with nausea but no vomiting.  Improved left lower quadrant discomfort reported.  Continues to have loose stools. ? ?Physical Exam: ?Vitals:  ? 11/18/21 1407 11/18/21 2051 11/19/21 0618 11/19/21 1420  ?BP: 127/60 131/66 (!) 137/57 137/61  ?Pulse: 100 88 73 67  ?Resp: 17 17 18 16   ?Temp: 98.1 ?F (36.7 ?C) 98.6 ?F (37 ?C) 98.5 ?F (36.9 ?C) 98 ?F (36.7 ?C)  ?TempSrc: Oral Oral Oral   ?SpO2: 98% 99% 97% 100%  ?Weight:      ?Height:      ? ?General exam: Alert, awake, oriented x 3; reports mild nausea but no further vomiting; improvement in her left lower quadrant abdominal discomfort.  No fever ?Respiratory system: Clear to auscultation. Respiratory effort normal.  Good saturation on room air; no using accessory muscle. ?Cardiovascular system:RRR. No rubs or gallops.  No JVD. ?Gastrointestinal system: Abdomen is nondistended, soft and without guarding; right lower quadrant tenderness on deep palpation reported.  Positive bowel sounds.   ?Central nervous system: Alert and oriented. No focal neurological deficits. ?Extremities: No cyanosis or clubbing; no edema. ?Skin: No petechiae. ?Psychiatry: Judgement and insight appear normal. Mood & affect appropriate.  ? ?Data Reviewed: ?CBC demonstrating WBC 7.9, hemoglobin 10.2 platelets count 139 K ?Basic metabolic panel: Sodium 601, potassium 3.7, BUN 15 and creatinine 1.59 ? ?  Family Communication: No family at bedside. ? ?Disposition: ?Status is: Inpatient ? ?Remains inpatient appropriate because: Continue IV fluid resuscitation, follow  renal function, continue current antibiotics and assess clinical response.  Replete electrolytes as needed.  Patient requiring IV antibiotic therapy for severe sepsis due to colitis. ? ? Planned Discharge Destination: Home ? ? ? ?Author: ?Barton Dubois, MD ?11/19/2021 5:49 PM ? ?For on call review [email or web address].  ?

## 2021-11-20 DIAGNOSIS — K219 Gastro-esophageal reflux disease without esophagitis: Secondary | ICD-10-CM | POA: Diagnosis not present

## 2021-11-20 DIAGNOSIS — N179 Acute kidney failure, unspecified: Secondary | ICD-10-CM | POA: Diagnosis not present

## 2021-11-20 DIAGNOSIS — I10 Essential (primary) hypertension: Secondary | ICD-10-CM | POA: Diagnosis not present

## 2021-11-20 DIAGNOSIS — K529 Noninfective gastroenteritis and colitis, unspecified: Secondary | ICD-10-CM | POA: Diagnosis not present

## 2021-11-20 LAB — BASIC METABOLIC PANEL
Anion gap: 1 — ABNORMAL LOW (ref 5–15)
BUN: 10 mg/dL (ref 8–23)
CO2: 22 mmol/L (ref 22–32)
Calcium: 8 mg/dL — ABNORMAL LOW (ref 8.9–10.3)
Chloride: 120 mmol/L — ABNORMAL HIGH (ref 98–111)
Creatinine, Ser: 1.44 mg/dL — ABNORMAL HIGH (ref 0.44–1.00)
GFR, Estimated: 37 mL/min — ABNORMAL LOW (ref >60)
Glucose, Bld: 109 mg/dL — ABNORMAL HIGH (ref 70–99)
Potassium: 4.1 mmol/L (ref 3.5–5.1)
Sodium: 143 mmol/L (ref 135–145)

## 2021-11-20 MED ORDER — SACCHAROMYCES BOULARDII 250 MG PO CAPS: 250.0000 mg | ORAL_CAPSULE | Freq: Two times a day (BID) | ORAL | 0 refills | Status: DC

## 2021-11-20 MED ORDER — AMOXICILLIN-POT CLAVULANATE 500-125 MG PO TABS: 1.0000 | ORAL_TABLET | Freq: Two times a day (BID) | ORAL | 0 refills | Status: AC

## 2021-11-20 MED ORDER — LISINOPRIL 40 MG PO TABS: 20.0000 mg | ORAL_TABLET | Freq: Every day | ORAL | Status: AC

## 2021-11-20 MED ORDER — SACCHAROMYCES BOULARDII 250 MG PO CAPS: 250.0000 mg | ORAL_CAPSULE | Freq: Two times a day (BID) | ORAL | 0 refills | Status: AC

## 2021-11-20 NOTE — Discharge Summary (Signed)
?Physician Discharge Summary ?  ?Patient: Kim Schultz MRN: [id number] DOB: 10-13-1940  ?Admit date:     11/17/2021  ?Discharge date: 11/20/21  ?Discharge Physician: Barton Dubois  ? ?PCP: Hyler, Gwen Her, NP  ? ?Recommendations at discharge:  ?Reassess blood pressure and adjust antihypertensive treatment as needed ?Repeat basic metabolic panel to follow electrolytes and renal function ?Reassess complete resolution of patient's symptoms after termination of antibiotic therapy for acute colitis. ? ?Discharge Diagnoses: ?Principal Problem: ?  Colitis ?Active Problems: ?  Acute kidney injury superimposed on chronic kidney disease ([place]) ?  Hypertension ?  Thyroid disease ?  GERD (gastroesophageal reflux disease) ? ?Hospital Course: ?As per H&P written by Dr. Cathlean Sauer on 11/17/2021 ?Kim Schultz is a 81 y.o. female with medical history significant of hypertension and GERD who presented with nausea, vomiting, diarrhea and abdominal pain.  ?Reported acute onset of nausea and vomiting last night, she had profuse diarrhea for about 4 hours, feeling very weak and deconditioned, positive paresthesias on her feet and difficulty ambulating.  ?During the course of today she continue to have intermittent rectal bleeding, bright red with no clots.  ?Abdominal pain colicky in nature, intermittent, 10/10 in intensity with no improving or worsening factors.  ?She has not taken her home medications but she did yesterday.  ?  ?Reports intermittent nausea and vomiting self resolved for over one year, but no diarrhea like this time.  ?  ?Denies any sick contacts on recent change in eating patterns, no recent travel. ?No fever of chills.   ?Assessment and Plan: ?* Colitis ?-Acute colitis, affecting descending and sigmoid colon. ?-Patient met criteria for severe sepsis on presentation with identified source of infection, elevated WBCs, elevated lactic acid, elevated respiratory rate and acute kidney injury as part of organ  dysfunction. ?-After fluid resuscitation and IV antibiotics condition has stabilized; sepsis features resolved and patient tolerating diet prior to discharge. ?-She will complete treatment using oral Augmentin and will follow-up with PCP in 10 days. ?-Continue as needed antiemetics and the use of Florastor as prescribed. ? ? ? ?Acute kidney injury superimposed on chronic kidney disease ([place]) ?-CKD stage 3a with base cr around 1.5 at baseline ?-In the setting of GI losses, decreased oral intake and prerenal azotemia ?-At discharge renal function back to baseline; safe to resume adjusted dose of lisinopril. ?-Patient advised to maintain adequate hydration and to follow heart healthy diet. ?-Follow renal function trend with repeat basic metabolic panel at follow-up visit. ? ? ? ?Hypertension ?-Patient with hypotension due to volume depletion. ?-Continue to closely follow vital signs. ?-Safe to resume home antihypertensive agents at adjusted dose. ? ?Thyroid disease ?-Continue tx with levothyroxine.  ?-Outpatient follow-up of her thyroid panel recommended. ? ?GERD (gastroesophageal reflux disease) ?-continue PPI. ? ?Overweight: ?-Body mass index is 29.13 kg/m?. ?-Low calorie diet, portion control and increase physical activity discussed with patient. ?  ? ?Consultants: None ?Procedures performed: See below for x-ray report. ?Disposition: Home ?Diet recommendation:  ?Discharge Diet Orders (From admission, onward)  ? ?  Start     Ordered  ? 11/20/21 0000  Diet - low sodium heart healthy       ? 11/20/21 1646  ? ?  ?  ? ?  ? ?Cardiac diet ?DISCHARGE MEDICATION: ?Allergies as of 11/20/2021   ? ?   Reactions  ? Naprosyn [naproxen] Other (See Comments)  ? Intolerance  ? ?  ? ?  ?Medication List  ?  ? ?STOP taking these medications   ? ?  loperamide 2 MG tablet ?Commonly known as: IMODIUM A-D ?  ? ?  ? ?TAKE these medications   ? ?ALPRAZolam 0.5 MG tablet ?Commonly known as: Duanne Moron ?Take 0.5 mg by mouth 2 (two) times daily as  needed for anxiety. ?  ?amoxicillin-clavulanate 500-125 MG tablet ?Commonly known as: Augmentin ?Take 1 tablet (500 mg total) by mouth every 12 (twelve) hours for 9 days. ?  ?cyclobenzaprine 10 MG tablet ?Commonly known as: FLEXERIL ?One tablet twice a day as needed for spasm ?What changed:  ?how much to take ?how to take this ?when to take this ?reasons to take this ?additional instructions ?  ?dicyclomine 10 MG capsule ?Commonly known as: BENTYL ?TAKE ONE CAPSULE BY MOUTH 4 TIMES DAILY-BEFORE MEALS AND AT BEDTIME. ?What changed: See the new instructions. ?  ?HYDROcodone-acetaminophen 5-325 MG tablet ?Commonly known as: NORCO/VICODIN ?One tablet by mouth every six hours as needed for pain. ?What changed:  ?how much to take ?how to take this ?when to take this ?reasons to take this ?additional instructions ?  ?levothyroxine 88 MCG tablet ?Commonly known as: SYNTHROID ?Take 88 mcg by mouth every morning. ?  ?lisinopril 40 MG tablet ?Commonly known as: ZESTRIL ?Take 0.5 tablets (20 mg total) by mouth daily. ?What changed: how much to take ?  ?omeprazole 20 MG capsule ?Commonly known as: PRILOSEC ?Take 1 capsule (20 mg total) by mouth daily. ?  ?saccharomyces boulardii 250 MG capsule ?Commonly known as: FLORASTOR ?Take 1 capsule (250 mg total) by mouth 2 (two) times daily for 15 days. ?  ?zolpidem 10 MG tablet ?Commonly known as: AMBIEN ?Take 10 mg by mouth at bedtime as needed for sleep. ?  ? ?  ? ? Follow-up Information   ? ? Hyler, Gwen Her, NP. Schedule an appointment as soon as possible for a visit in 10 day(s).   ?Specialty: Nephrology ?Contact information: ?[address] ?[address] ?[place] [address] ?[phone] ? ? ?  ?  ? ?  ?  ? ?  ? ?Discharge Exam: ?Danley Danker Weights  ? 11/17/21 2300  ?Weight: 84.4 kg  ? ?General exam: Alert, awake, oriented x 3; reports mild nausea but no further vomiting; improvement in her left lower quadrant abdominal discomfort.  No fever ?Respiratory system: Clear to auscultation.  Respiratory effort normal.  Good saturation on room air; no using accessory muscle. ?Cardiovascular system:RRR. No rubs or gallops.  No JVD. ?Gastrointestinal system: Abdomen is nondistended, soft and without guarding; no tenderness appreciated on palpation.  Positive bowel sounds. ?Central nervous system: Alert and oriented. No focal neurological deficits. ?Extremities: No cyanosis or clubbing; no edema. ?Skin: No petechiae. ?Psychiatry: Judgement and insight appear normal. Mood & affect appropriate.  ? ?Condition at discharge: Stable and improved. ? ?The results of significant diagnostics from this hospitalization (including imaging, microbiology, ancillary and laboratory) are listed below for reference.  ? ?Imaging Studies: ?CT ABDOMEN PELVIS WO CONTRAST ? ?Result Date: 11/17/2021 ?CLINICAL DATA:  Rectal bleeding. EXAM: CT ABDOMEN AND PELVIS WITHOUT CONTRAST TECHNIQUE: Multidetector CT imaging of the abdomen and pelvis was performed following the standard protocol without IV contrast. RADIATION DOSE REDUCTION: This exam was performed according to the departmental dose-optimization program which includes automated exposure control, adjustment of the mA and/or kV according to patient size and/or use of iterative reconstruction technique. COMPARISON:  None Available. FINDINGS: Lower chest: No acute abnormality. Hepatobiliary: No focal liver abnormality is seen. No gallstones, gallbladder wall thickening, or biliary dilatation. Pancreas: There is diffuse fatty infiltration of the  pancreatic parenchyma. No pancreatic ductal dilatation or surrounding inflammatory changes. Spleen: Normal in size without focal abnormality. Adrenals/Urinary Tract: Adrenal glands are unremarkable. Left kidney is atrophic in appearance. The right kidney is normal in size, without renal calculi or focal lesions. There is mild right-sided hydronephrosis. Bladder is unremarkable. Stomach/Bowel: Stomach is within normal limits. The appendix is  not clearly identified. No evidence of dilatation. Mild diffuse thickening of the descending and sigmoid colon is seen with a mild amount of pericolonic inflammatory fat stranding. Vascular/Lymphatic: Aortic atherosclerosis.

## 2021-11-20 NOTE — Progress Notes (Signed)
Patient is being discharged home. All discharge instructions reviewed including new medications. Patient and Patients daughter verbalized an understanding of all instructions given.  ?

## 2021-11-20 NOTE — Progress Notes (Signed)
Pharmacy Antibiotic Note ? ?Kim Schultz is a 81 y.o. female admitted on 11/17/2021 with  intra-abdominal infection .  Pharmacy has been consulted for zosyn dosing. ? ?Plan: ?Continue zosyn 3.375g IV every 8 hours. ? ?Height: 5\' 7"  (170.2 cm) ?Weight: 84.4 kg (186 lb) ?IBW/kg (Calculated) : 61.6 ? ?Temp (24hrs), Avg:98 ?F (36.7 ?C), Min:98 ?F (36.7 ?C), Max:98 ?F (36.7 ?C) ? ?Recent Labs  ?Lab 11/17/21 ?1852 11/17/21 ?1923 11/17/21 ?2006 11/17/21 ?2158 11/18/21 ?11/20/21 11/19/21 ?11/21/21 11/20/21 ?11/22/21  ?WBC 25.7*  --   --   --  17.9* 7.9  --   ?CREATININE 3.04* 3.30*  --   --  2.19* 1.59* 1.44*  ?LATICACIDVEN  --   --  2.4* 1.7  --   --   --   ?  ?Estimated Creatinine Clearance: 35.4 mL/min (A) (by C-G formula based on SCr of 1.44 mg/dL (H)).   ? ?Allergies  ?Allergen Reactions  ? Naprosyn [Naproxen] Other (See Comments)  ?  Intolerance ?  ? ? ?Antimicrobials this admission: ?Zosyn 5/12 ? ? ?Microbiology results: ?5/12 Bcx: NGTD  ?5/12 Cdiff: sent  ?5/12 GI panel: sent  ? ?Thank you for allowing pharmacy to be a part of this patient?s care. ? ?7/12 ?11/20/2021 8:34 AM ? ?

## 2021-11-20 NOTE — Care Management Important Message (Addendum)
Important Message ? ?Patient Details  ?Name: Kim Schultz ?MRN: [id number] ?Date of Birth: 1940-11-02 ? ? ?Medicare Important Message Given:  Yes ? ?Copy will be mailed to address on file ? ? ?Corey Harold ?11/20/2021, 4:13 PM ?

## 2021-11-22 LAB — CULTURE, BLOOD (ROUTINE X 2)
Culture: NO GROWTH
Culture: NO GROWTH
Special Requests: ADEQUATE
Special Requests: ADEQUATE

## 2021-12-19 ENCOUNTER — Ambulatory Visit: Payer: Medicare Other | Admitting: Orthopaedic Surgery

## 2022-01-04 ENCOUNTER — Ambulatory Visit (HOSPITAL_COMMUNITY): Payer: Medicare Other

## 2022-01-04 ENCOUNTER — Encounter (HOSPITAL_COMMUNITY): Payer: Self-pay

## 2022-01-04 ENCOUNTER — Other Ambulatory Visit: Payer: Self-pay

## 2022-01-23 ENCOUNTER — Ambulatory Visit: Payer: Medicare Other | Admitting: Cardiology

## 2022-03-01 ENCOUNTER — Encounter: Payer: Self-pay | Admitting: Cardiology

## 2022-03-01 ENCOUNTER — Ambulatory Visit (INDEPENDENT_AMBULATORY_CARE_PROVIDER_SITE_OTHER): Payer: Medicare Other | Admitting: Cardiology

## 2022-03-01 VITALS — BP 126/84 | HR 105 | Ht 67.0 in | Wt 170.8 lb

## 2022-03-01 DIAGNOSIS — R011 Cardiac murmur, unspecified: Secondary | ICD-10-CM

## 2022-03-01 DIAGNOSIS — R9431 Abnormal electrocardiogram [ECG] [EKG]: Secondary | ICD-10-CM

## 2022-03-01 DIAGNOSIS — I10 Essential (primary) hypertension: Secondary | ICD-10-CM | POA: Diagnosis not present

## 2022-03-01 NOTE — Progress Notes (Signed)
Cardiology Office Note  Date: 03/01/2022   ID: Jin, Shockley 08/22/1940, MRN [id number]  PCP:  Juel Burrow, NP  Cardiologist:  Nona Dell, MD Electrophysiologist:  None   Chief Complaint  Patient presents with   Abnormal ECG    History of Present Illness: Kim Schultz is a 81 y.o. female referred for cardiology consultation by Dr. Damaris Schooner for the evaluation of abnormal ECG.  I reviewed the available information.  She presents today without any specific complaints, specifically no chest pain with exertion, no increasing shortness of breath, no palpitations or syncope.  Tracing from June 29 showed sinus tachycardia with left anterior fascicular block and PVC, decreased R wave progression.  Previous tracing from July 2019 although reporting atrial fibrillation actually showed sinus tachycardia with decreased R wave progression and left anterior fascicular block.  She was hospitalized in May with acute colitis affecting the descending and sigmoid colon.  CT imaging is noted below.  She states that she has had trouble with recurring symptoms over time.  No obvious history of cardiac arrhythmia.  Follow-up ECG today showed sinus tachycardia at 102 bpm with decreased R wave progression, high lateral Q waves.  She has not undergone echocardiogram.  Reports that she was told that she had a heart murmur recently.   Past Medical History:  Diagnosis Date   Anxiety    Arthritis    GERD (gastroesophageal reflux disease)    Hypothyroidism    Insomnia     Past Surgical History:  Procedure Laterality Date   ABDOMINAL HYSTERECTOMY     Cataract surgery     ELBOW SURGERY Left    TONSILLECTOMY      Current Outpatient Medications  Medication Sig Dispense Refill   ALPRAZolam (XANAX) 0.5 MG tablet Take 0.5 mg by mouth 2 (two) times daily as needed for anxiety.     cyclobenzaprine (FLEXERIL) 10 MG tablet Take 10 mg by mouth 3 (three) times daily as needed for muscle spasms.      dicyclomine (BENTYL) 10 MG capsule Take 10 mg by mouth as needed for spasms.     levothyroxine (SYNTHROID) 88 MCG tablet Take 88 mcg by mouth every morning.     lisinopril (ZESTRIL) 40 MG tablet Take 0.5 tablets (20 mg total) by mouth daily.     omeprazole (PRILOSEC) 20 MG capsule Take 1 capsule (20 mg total) by mouth daily.     zolpidem (AMBIEN) 10 MG tablet Take 10 mg by mouth at bedtime as needed for sleep.     HYDROcodone-acetaminophen (NORCO/VICODIN) 5-325 MG tablet One tablet by mouth every six hours as needed for pain. (Patient not taking: Reported on 03/01/2022) 120 tablet 0   No current facility-administered medications for this visit.   Allergies:  Naprosyn [naproxen]   Social History: The patient  reports that she has never smoked. She has never used smokeless tobacco. She reports that she does not drink alcohol and does not use drugs.   Family History: The patient's family history includes Bone cancer in her sister; Heart disease in her mother.   ROS: No orthopnea or PND.  No leg swelling.  Physical Exam: VS:  BP 126/84 (BP Location: Left Arm, Patient Position: Sitting, Cuff Size: Normal)   Pulse (!) 105   Ht 5\' 7"  (1.702 m)   Wt 170 lb 12.8 oz (77.5 kg)   SpO2 95%   BMI 26.75 kg/m , BMI Body mass index is 26.75 kg/m.  Wt Readings from Last  3 Encounters:  03/01/22 170 lb 12.8 oz (77.5 kg)  11/17/21 186 lb (84.4 kg)  07/25/21 186 lb (84.4 kg)    General: Patient appears comfortable at rest. HEENT: Conjunctiva and lids normal. Neck: Supple, no elevated JVP or carotid bruits, no thyromegaly. Lungs: Clear to auscultation, nonlabored breathing at rest. Cardiac: Regular rate and rhythm, no S3, 1-2/6 systolic murmur, no pericardial rub. Abdomen: Soft, bowel sounds present. Extremities: No pitting edema, distal pulses 2+. Skin: Warm and dry. Musculoskeletal: No kyphosis. Neuropsychiatric: Alert and oriented x3, affect grossly appropriate.  ECG: As reported in the  HPI.  Recent Labwork: 11/17/2021: ALT 14; AST 41 11/19/2021: Hemoglobin 10.2; Platelets 139 11/20/2021: BUN 10; Creatinine, Ser 1.44; Potassium 4.1; Sodium 143  June 2023: BUN potassium 4.8, AST 15, ALT 6, hemoglobin 12.2, platelets 168, BUN 21, creatinine 1.72, cholesterol 135, triglycerides 87, HDL 39, LDL 79, TSH 0.66, hemoglobin A1c 5.4%  Other Studies Reviewed Today:  Chest x-ray 11/17/2021: IMPRESSION: No acute cardiopulmonary disease.  CT abdomen pelvis 11/17/2021: IMPRESSION: 1. Mild colitis involving the descending and sigmoid colon. 2. Mild right-sided hydronephrosis without evidence of renal calculi. 3. Atrophic left kidney. 4. Aortic atherosclerosis.  Assessment and Plan:  1.  Nonspecific abnormal ECG, sinus tachycardia today and on prior tracings with acute illness, but her resting heart rate does settle down under 100 on examination.  She does not report any sense of palpitations, no unexplained syncope.  Recent lab work shows normal hemoglobin and TSH.  Relatively soft cardiac murmur noted.  She has a history of hypertension and no documented cardiac arrhythmia.  Would obtain an echocardiogram for basic assessment of cardiac structure and function.  2.  Essential hypertension, on lisinopril.  Blood pressure control is reasonable today.  Medication Adjustments/Labs and Tests Ordered: Current medicines are reviewed at length with the patient today.  Concerns regarding medicines are outlined above.   Tests Ordered: Orders Placed This Encounter  Procedures   EKG 12-Lead   ECHOCARDIOGRAM COMPLETE    Medication Changes: No orders of the defined types were placed in this encounter.   Disposition:  Follow up  test results.  Signed, Jonelle Sidle, MD, [facility] 03/01/2022 3:27 PM    [facility] at [facility] [address] [place], [place], [place] [address] Phone: [phone]; Fax: [phone]

## 2022-03-01 NOTE — Patient Instructions (Addendum)

## 2022-03-05 ENCOUNTER — Ambulatory Visit: Payer: Medicare Other | Attending: Cardiology

## 2022-03-05 DIAGNOSIS — R011 Cardiac murmur, unspecified: Secondary | ICD-10-CM | POA: Diagnosis not present

## 2022-03-05 LAB — ECHOCARDIOGRAM COMPLETE
AR max vel: 2.28 cm2
AV Area VTI: 2.29 cm2
AV Area mean vel: 2.4 cm2
AV Mean grad: 5.3 mmHg
AV Peak grad: 10.1 mmHg
Ao pk vel: 1.59 m/s
Area-P 1/2: 4.86 cm2
Calc EF: 58.8 %
S' Lateral: 1.79 cm
Single Plane A2C EF: 61.8 %
Single Plane A4C EF: 61.7 %

## 2022-04-03 ENCOUNTER — Encounter: Payer: Self-pay | Admitting: Orthopaedic Surgery

## 2022-04-03 ENCOUNTER — Ambulatory Visit (INDEPENDENT_AMBULATORY_CARE_PROVIDER_SITE_OTHER): Payer: Medicare Other | Admitting: Orthopaedic Surgery

## 2022-04-03 VITALS — BP 121/79 | HR 91

## 2022-04-03 DIAGNOSIS — G8929 Other chronic pain: Secondary | ICD-10-CM

## 2022-04-03 DIAGNOSIS — M25562 Pain in left knee: Secondary | ICD-10-CM | POA: Diagnosis not present

## 2022-04-03 MED ORDER — CYCLOBENZAPRINE HCL 10 MG PO TABS: 10.0000 mg | ORAL_TABLET | Freq: Three times a day (TID) | ORAL | 3 refills | Status: DC | PRN

## 2022-04-03 MED ORDER — METHYLPREDNISOLONE ACETATE 40 MG/ML IJ SUSP
40.0000 mg | Freq: Once | INTRAMUSCULAR | Status: AC
  Administered 2022-04-03: 40 mg via INTRA_ARTICULAR

## 2022-04-03 NOTE — Progress Notes (Signed)
PROCEDURE NOTE:  The patient requests injections of the left knee , verbal consent was obtained.  The left knee was prepped appropriately after time out was performed.   Sterile technique was observed and injection of 1 cc of DepoMedrol 40 mg with several cc's of plain xylocaine. Anesthesia was provided by ethyl chloride and a 20-gauge needle was used to inject the knee area. The injection was tolerated well.  A band aid dressing was applied.  The patient was advised to apply ice later today and tomorrow to the injection sight as needed.  Encounter Diagnosis  Name Primary?   Chronic pain of left knee Yes   I will refill her flexeril.  Return in one month.  Call if any problem.  Precautions discussed.  Electronically Signed Sanjuana Kava, MD 9/26/202310:52 AM

## 2022-05-01 ENCOUNTER — Ambulatory Visit: Payer: Medicare Other | Admitting: Orthopaedic Surgery

## 2022-05-29 ENCOUNTER — Ambulatory Visit: Payer: Medicare Other | Admitting: Orthopaedic Surgery

## 2022-06-02 NOTE — Progress Notes (Deleted)
Cardiology Office Note:    Date:  06/02/2022   ID:  Kim Schultz, DOB [birth year], MRN [id number]  PCP:  Kim Pacer, NP   [facility] Providers Cardiologist:  Kim Dell, MD { Click to update primary MD,subspecialty MD or APP then REFRESH:1}    Referring MD: Kim Schultz*   No chief complaint on file. ***  History of Present Illness:    Kim Schultz is a 81 y.o. female with a hx of ***  HTN GERD Hypothyroidism CKD stage 3  She was initially referred for cardiology consultation by Dr. Damaris Schultz and was initially evaluated by Dr. Diona Schultz on March 01, 2022 due to abnormal EKG.  EKG from June 29 revealed sinus tachycardia with left anterior fascicular block and PVC, decreased R wave progression.  Previous tracing from 2019 revealed sinus tach with decreased R wave progression and left anterior fascicular block although this reported to be atrial fibrillation.    In May 2023, she was hospitalized with acute colitis affecting the sigmoid and descending colon. In addition to this, CT scan also revealed mild right sided hydronephrosis without evidence of renal calculi, atrophic left kidney, and aortic atherosclerosis unfortunately, she has had difficulty with recurring symptoms since then.   Last seen by Dr. Diona Schultz on March 01, 2022 and was overall reporting doing very well from a cardiac perspective.  She denied any shortness of breath, chest pain, or any other cardiac chief complaints or concerns.  EKG in office that day revealed sinus tachycardia, 102 bpm, with decreased R wave progression, high lateral Q waves.  Dr. Diona Schultz arranged a 2D echocardiogram that revealed LVEF greater than 75%, mildly calcified aortic valve without stenosis, and small pericardial effusion that was unlikely to be of clinical significance.  Her blood pressure was well-controlled on her current medications at that time.  Today she presents for follow-up.  She  states.   Abnormal ECG Murmur HTN CKD stage 3b   Past Medical History:  Diagnosis Date   Anxiety    Arthritis    GERD (gastroesophageal reflux disease)    Hypothyroidism    Insomnia     Past Surgical History:  Procedure Laterality Date   ABDOMINAL HYSTERECTOMY     Cataract surgery     ELBOW SURGERY Left    TONSILLECTOMY      Current Medications: No outpatient medications have been marked as taking for the 06/04/22 encounter (Appointment) with Kim Dory, NP.     Allergies:   Naprosyn [naproxen]   Social History   Socioeconomic History   Marital status: Widowed    Spouse name: Not on file   Number of children: Not on file   Years of education: Not on file   Highest education level: Not on file  Occupational History   Not on file  Tobacco Use   Smoking status: Never   Smokeless tobacco: Never  Substance and Sexual Activity   Alcohol use: No   Drug use: No   Sexual activity: Never  Other Topics Concern   Not on file  Social History Narrative   Not on file   Social Determinants of Health   Financial Resource Strain: Not on file  Food Insecurity: Not on file  Transportation Needs: Not on file  Physical Activity: Not on file  Stress: Not on file  Social Connections: Not on file     Family History: The patient's ***family history includes Bone cancer in her sister; Heart disease in her mother.  ROS:   Please see the history of present illness.    *** All other systems reviewed and are negative.  EKGs/Labs/Other Studies Reviewed:    The following studies were reviewed today: ***  EKG:  EKG is *** ordered today.  The ekg ordered today demonstrates ***  Recent Labs: 11/17/2021: ALT 14 11/19/2021: Hemoglobin 10.2; Platelets 139 11/20/2021: BUN 10; Creatinine, Ser 1.44; Potassium 4.1; Sodium 143  Recent Lipid Panel No results found for: "CHOL", "TRIG", "HDL", "CHOLHDL", "VLDL", "LDLCALC", "LDLDIRECT"   Risk Assessment/Calculations:   {Does  this patient have ATRIAL FIBRILLATION?:[phone]}  No BP recorded.  {Refresh Note OR Click here to enter BP  :1}***         Physical Exam:    VS:  There were no vitals taken for this visit.    Wt Readings from Last 3 Encounters:  03/01/22 170 lb 12.8 oz (77.5 kg)  11/17/21 186 lb (84.4 kg)  07/25/21 186 lb (84.4 kg)     GEN: *** Well nourished, well developed in no acute distress HEENT: Normal NECK: No JVD; No carotid bruits LYMPHATICS: No lymphadenopathy CARDIAC: ***RRR, no murmurs, rubs, gallops RESPIRATORY:  Clear to auscultation without rales, wheezing or rhonchi  ABDOMEN: Soft, non-tender, non-distended MUSCULOSKELETAL:  No edema; No deformity  SKIN: Warm and dry NEUROLOGIC:  Alert and oriented x 3 PSYCHIATRIC:  Normal affect   ASSESSMENT:    No diagnosis found. PLAN:    In order of problems listed above:  ***      {Are you ordering a CV Procedure (e.g. stress test, cath, DCCV, TEE, etc)?   Press F2        :[id number]    Medication Adjustments/Labs and Tests Ordered: Current medicines are reviewed at length with the patient today.  Concerns regarding medicines are outlined above.  No orders of the defined types were placed in this encounter.  No orders of the defined types were placed in this encounter.   There are no Patient Instructions on file for this visit.   SignedFinis Bud, NP  06/02/2022 11:28 AM    [place]

## 2022-06-04 ENCOUNTER — Ambulatory Visit: Payer: Medicare Other | Admitting: Nurse Practitioner

## 2022-06-05 ENCOUNTER — Encounter: Payer: Self-pay | Admitting: Orthopaedic Surgery

## 2022-06-05 ENCOUNTER — Ambulatory Visit (INDEPENDENT_AMBULATORY_CARE_PROVIDER_SITE_OTHER): Payer: Medicare Other | Admitting: Orthopaedic Surgery

## 2022-06-05 VITALS — BP 130/70 | HR 86

## 2022-06-05 DIAGNOSIS — G894 Chronic pain syndrome: Secondary | ICD-10-CM

## 2022-06-05 DIAGNOSIS — M25562 Pain in left knee: Secondary | ICD-10-CM

## 2022-06-05 DIAGNOSIS — G8929 Other chronic pain: Secondary | ICD-10-CM | POA: Diagnosis not present

## 2022-06-05 MED ORDER — METHYLPREDNISOLONE ACETATE 40 MG/ML IJ SUSP
40.0000 mg | Freq: Once | INTRAMUSCULAR | Status: AC
  Administered 2022-06-05: 40 mg via INTRA_ARTICULAR

## 2022-06-05 NOTE — Progress Notes (Signed)
PROCEDURE NOTE:  The patient requests injections of the left knee , verbal consent was obtained.  The left knee was prepped appropriately after time out was performed.   Sterile technique was observed and injection of 1 cc of DepoMedrol 40 mg with several cc's of plain xylocaine. Anesthesia was provided by ethyl chloride and a 20-gauge needle was used to inject the knee area. The injection was tolerated well.  A band aid dressing was applied.  The patient was advised to apply ice later today and tomorrow to the injection sight as needed.  Encounter Diagnoses  Name Primary?   Chronic pain of left knee Yes   Chronic pain syndrome    Return in six weeks.  Call if any problem.  Precautions discussed.  Electronically Signed Darreld Mclean, MD 11/28/20232:24 PM

## 2022-06-05 NOTE — Addendum Note (Signed)
Addended by: Recardo Evangelist A on: 06/05/2022 03:23 PM   Modules accepted: Orders

## 2022-06-27 ENCOUNTER — Telehealth: Payer: Self-pay | Admitting: Orthopaedic Surgery

## 2022-07-17 ENCOUNTER — Ambulatory Visit: Payer: Medicare Other | Admitting: Orthopaedic Surgery

## 2022-07-24 ENCOUNTER — Encounter: Payer: Self-pay | Admitting: Orthopaedic Surgery

## 2022-07-24 ENCOUNTER — Telehealth: Payer: Self-pay | Admitting: Orthopaedic Surgery

## 2022-07-24 ENCOUNTER — Ambulatory Visit (INDEPENDENT_AMBULATORY_CARE_PROVIDER_SITE_OTHER): Payer: Medicare Other

## 2022-07-24 ENCOUNTER — Ambulatory Visit (INDEPENDENT_AMBULATORY_CARE_PROVIDER_SITE_OTHER): Payer: Medicare Other | Admitting: Orthopaedic Surgery

## 2022-07-24 VITALS — BP 181/93 | HR 92

## 2022-07-24 DIAGNOSIS — M25522 Pain in left elbow: Secondary | ICD-10-CM

## 2022-07-24 DIAGNOSIS — M21922 Unspecified acquired deformity of left upper arm: Secondary | ICD-10-CM

## 2022-07-24 DIAGNOSIS — G8929 Other chronic pain: Secondary | ICD-10-CM

## 2022-07-24 MED ORDER — TRAMADOL HCL 50 MG PO TABS: 50.0000 mg | ORAL_TABLET | Freq: Four times a day (QID) | ORAL | 0 refills | Status: AC | PRN

## 2022-07-24 NOTE — Patient Instructions (Signed)
Aspercreme, Biofreeze, Blue Emu or Voltaren Gel over the counter 2-3 times daily. Rub into area well each use for best results.  Your pain medication is being changed to Toradol because you are having an allergic reaction to the hydrocodone.  As the weather changes and gets cooler, you may notice you are affected more. You may have more pain in your joints. This is normal. Dress warmly and make sure that area is covered well.   Dr.Keeling is here all day on Tuesdays. He is here half a day on Wednesday mornings, and Thursday mornings. If you need anything such as a medication refill, please either call BEFORE the end of the day on [facility] or send a message through [facility]. Your pharmacy can send a refill request for you. Calling by the end of the day on [facility] allows [place] time to send Dr.Keeling the request and for him to respond before he leaves on Thursdays.

## 2022-07-24 NOTE — Progress Notes (Signed)
My left elbow is hurting  She has had pain in the left elbow for a few weeks.  She had an old injury and deformity of the left elbow and post surgery years ago.  It has done well until now.  She has no new trauma, no redness, no swelling.  There is slight deformity of the left elbow with ROM 25 to 90 with pain.  Wires are felt proximal ulna area.  NV intact.  No redness, no swelling.  X-rays were done of the left elbow, reported separately.  Encounter Diagnoses  Name Primary?   Acquired deformity of left elbow Yes   Elbow pain, chronic, left    There is deformity of the left elbow and chronic dislocation present on X-rays done in 2020.  No new fracture is noted.  She is having problem taking the hydrocodone.  I will try Ultram po.  I have reviewed the [place] web site prior to prescribing narcotic medicine for this patient.    Return in two weeks.  Call if any problem.  Precautions discussed.  Electronically Signed Sanjuana Kava, MD 1/16/20243:33 PM

## 2022-07-24 NOTE — Telephone Encounter (Signed)
When the patient was checking out, she stated she forgot to ask for a refill for Cyclobenzaprine to be sent to [facility].  Pt's # [phone]

## 2022-07-24 NOTE — Telephone Encounter (Signed)
Sent to provider 

## 2022-07-25 MED ORDER — CYCLOBENZAPRINE HCL 10 MG PO TABS: ORAL_TABLET | ORAL | 0 refills | Status: DC

## 2022-08-07 ENCOUNTER — Encounter: Payer: Self-pay | Admitting: Orthopaedic Surgery

## 2022-08-07 ENCOUNTER — Ambulatory Visit (INDEPENDENT_AMBULATORY_CARE_PROVIDER_SITE_OTHER): Payer: Medicare Other | Admitting: Orthopaedic Surgery

## 2022-08-07 VITALS — BP 100/60 | HR 76

## 2022-08-07 DIAGNOSIS — G8929 Other chronic pain: Secondary | ICD-10-CM | POA: Diagnosis not present

## 2022-08-07 DIAGNOSIS — M21922 Unspecified acquired deformity of left upper arm: Secondary | ICD-10-CM

## 2022-08-07 DIAGNOSIS — M25522 Pain in left elbow: Secondary | ICD-10-CM | POA: Diagnosis not present

## 2022-08-07 MED ORDER — CYCLOBENZAPRINE HCL 10 MG PO TABS: ORAL_TABLET | ORAL | 1 refills | Status: DC

## 2022-08-07 MED ORDER — TRAMADOL HCL 50 MG PO TABS: 50.0000 mg | ORAL_TABLET | Freq: Four times a day (QID) | ORAL | 3 refills | Status: AC | PRN

## 2022-08-07 NOTE — Progress Notes (Signed)
I feel better.  The ultram agreed with her.  I will refill.  She has less left elbow pain, no trauma.  NV intact. ROM of elbow is limited.    Encounter Diagnoses  Name Primary?   Elbow pain, chronic, left Yes   Acquired deformity of left elbow    I also refilled her Flexeril.  Call if any problem.  Precautions discussed.  Return in one month.  Electronically Signed Sanjuana Kava, MD 1/30/20243:30 PM

## 2022-09-04 ENCOUNTER — Encounter: Payer: Self-pay | Admitting: Orthopaedic Surgery

## 2022-09-04 ENCOUNTER — Ambulatory Visit (INDEPENDENT_AMBULATORY_CARE_PROVIDER_SITE_OTHER): Payer: Medicare Other | Admitting: Orthopaedic Surgery

## 2022-09-04 DIAGNOSIS — M25522 Pain in left elbow: Secondary | ICD-10-CM

## 2022-09-04 DIAGNOSIS — G8929 Other chronic pain: Secondary | ICD-10-CM | POA: Diagnosis not present

## 2022-09-04 MED ORDER — TRAMADOL HCL 50 MG PO TABS: ORAL_TABLET | ORAL | 4 refills | Status: DC

## 2022-09-04 NOTE — Patient Instructions (Addendum)
Please provide patient with the main number for billing so she can discuss her bills.   [facility] Billing Department: [phone] or [phone]  I will call your heart doctor to see about an appt.

## 2022-09-04 NOTE — Progress Notes (Signed)
My elbow is sore with the weather changes.  She has had more elbow pain on the left with the weather being warm one day and freezing the next.  She has no new trauma, no untoward events, no numbness.  She has decreased motion of the left elbow and has deformity long standing.  NV intact.  Encounter Diagnosis  Name Primary?   Elbow pain, chronic, left Yes   I will refill pain medicine.  I have reviewed the [place] web site prior to prescribing narcotic medicine for this patient.  She has need for help with billing and other doctor appointments.  I will have staff help her on this.  Return in six weeks.  Call if any problem.  Precautions discussed.  Electronically Signed Sanjuana Kava, MD 2/27/20242:43 PM

## 2022-09-06 ENCOUNTER — Telehealth: Payer: Self-pay | Admitting: Orthopaedic Surgery

## 2022-09-22 ENCOUNTER — Emergency Department (HOSPITAL_COMMUNITY)
Admission: EM | Admit: 2022-09-22 | Discharge: 2022-09-22 | Disposition: A | Discharge status: Home / Self Care | Payer: Medicare Other | Attending: Emergency Medicine | Admitting: Emergency Medicine

## 2022-09-22 ENCOUNTER — Emergency Department (HOSPITAL_COMMUNITY): Payer: Medicare Other

## 2022-09-22 ENCOUNTER — Other Ambulatory Visit: Payer: Self-pay

## 2022-09-22 ENCOUNTER — Encounter (HOSPITAL_COMMUNITY): Payer: Self-pay

## 2022-09-22 DIAGNOSIS — N189 Chronic kidney disease, unspecified: Secondary | ICD-10-CM | POA: Diagnosis not present

## 2022-09-22 DIAGNOSIS — Z79899 Other long term (current) drug therapy: Secondary | ICD-10-CM | POA: Insufficient documentation

## 2022-09-22 DIAGNOSIS — M545 Low back pain, unspecified: Secondary | ICD-10-CM | POA: Diagnosis present

## 2022-09-22 DIAGNOSIS — E039 Hypothyroidism, unspecified: Secondary | ICD-10-CM | POA: Diagnosis not present

## 2022-09-22 DIAGNOSIS — M5136 Other intervertebral disc degeneration, lumbar region: Secondary | ICD-10-CM | POA: Diagnosis not present

## 2022-09-22 DIAGNOSIS — M5432 Sciatica, left side: Secondary | ICD-10-CM | POA: Diagnosis not present

## 2022-09-22 MED ORDER — DEXAMETHASONE SODIUM PHOSPHATE 10 MG/ML IJ SOLN
8.0000 mg | Freq: Once | INTRAMUSCULAR | Status: AC
  Administered 2022-09-22: 8 mg via INTRAMUSCULAR
  Filled 2022-09-22: qty 1

## 2022-09-22 MED ORDER — OXYCODONE HCL 5 MG PO TABS
5.0000 mg | ORAL_TABLET | Freq: Once | ORAL | Status: AC
  Administered 2022-09-22: 5 mg via ORAL
  Filled 2022-09-22: qty 1

## 2022-09-22 MED ORDER — ACETAMINOPHEN 325 MG PO TABS
650.0000 mg | ORAL_TABLET | Freq: Once | ORAL | Status: AC
  Administered 2022-09-22: 650 mg via ORAL
  Filled 2022-09-22: qty 2

## 2022-09-22 NOTE — ED Triage Notes (Signed)
Pt reports waking up with pain from her left lower back all the way down her leg, denies any injury.

## 2022-09-22 NOTE — Discharge Instructions (Addendum)
Evaluation today revealed that you likely have sciatica and also degenerative disease in your lumbar spine which can be causing some narrowing and compression on your nerve roots.  Combination of both these things is likely causing your pain.  Recommend you follow-up with your PCP for further evaluation.  In the meantime you can take ibuprofen and Tylenol for your symptoms.

## 2022-09-22 NOTE — ED Provider Notes (Signed)
[facility] Provider Note   CSN: [id number] Arrival date & time: 09/22/22  1459     History  Chief Complaint  Patient presents with   Back Pain   HPI Kim Schultz is a 82 y.o. female with arthritis, hypothyroidism, and CKD presenting for back pain. Started all of a sudden this morning. She was attempting get out of bed and noticed pain located in the left lower back and upper buttock and radiates to the left foot.  It is sharp and stabbing pain. States that she can still ambulate and bear weight but it is painful.  Denies falls or any other recent trauma.  Denies fever or chills.   Back Pain      Home Medications Prior to Admission medications   Medication Sig Start Date End Date Taking? Authorizing Provider  cyclobenzaprine (FLEXERIL) 10 MG tablet TAKE (1) TABLET BY MOUTH 3 TIMES DAILY AS NEEDED FOR MUSCLE SPASMS. 09/09/22   Sanjuana Kava, MD  ALPRAZolam Duanne Moron) 0.5 MG tablet Take 0.5 mg by mouth 2 (two) times daily as needed for anxiety.    [provider]  dicyclomine (BENTYL) 10 MG capsule Take 10 mg by mouth as needed for spasms.    [provider]  levothyroxine (SYNTHROID) 88 MCG tablet Take 88 mcg by mouth every morning. 10/04/21   [provider]  lisinopril (ZESTRIL) 40 MG tablet Take 0.5 tablets (20 mg total) by mouth daily. 11/20/21   Barton Dubois, MD  omeprazole (PRILOSEC) 20 MG capsule Take 1 capsule (20 mg total) by mouth daily. 03/27/19   Barton Dubois, MD  traMADol (ULTRAM) 50 MG tablet One tablet every six hours as needed for pain. 09/04/22   Sanjuana Kava, MD  zolpidem (AMBIEN) 10 MG tablet Take 10 mg by mouth at bedtime as needed for sleep.    [provider]      Allergies    Naprosyn [naproxen]    Review of Systems   Review of Systems  Musculoskeletal:  Positive for back pain.    Physical Exam Updated Vital Signs BP 122/72   Pulse 93   Temp 98.1 F (36.7 C) (Oral)    Resp 18   Ht 5\' 7"  (1.702 m)   Wt 78 kg   SpO2 95%   BMI 26.94 kg/m  Physical Exam Constitutional:      Appearance: Normal appearance.  HENT:     Head: Normocephalic.     Nose: Nose normal.  Eyes:     Conjunctiva/sclera: Conjunctivae normal.  Cardiovascular:     Pulses:          Dorsalis pedis pulses are 2+ on the right side and 2+ on the left side.  Pulmonary:     Effort: Pulmonary effort is normal.  Musculoskeletal:     Comments: Gait appeared normal without assistance, patient did endorse pain.  No midline or parasternal tenderness of the lower back.  Patellar and Achilles reflexes 2+ bilaterally.  Range of motion of both hips normal.  Pain elicited with passive internal/external rotation of the left hip.  No obvious deformity, ecchymosis, crepitus in the left hip or buttock region.  Neurological:     Mental Status: She is alert.  Psychiatric:        Mood and Affect: Mood normal.     ED Results / Procedures / Treatments   Labs (all labs ordered are listed, but only abnormal results are displayed) Labs Reviewed - No data to display  EKG None  Radiology DG Lumbar Spine Complete  Result Date: 09/22/2022 CLINICAL DATA:  Lower back pain radiating down the left leg. EXAM: LUMBAR SPINE - COMPLETE 4+ VIEW COMPARISON:  March 26, 2019 FINDINGS: There is no evidence of an acute lumbar spine fracture. There is mild, stable dextroscoliosis of the lumbar spine. Moderate severity multilevel endplate sclerosis is seen throughout all levels of the lumbar spine with marked severity lateral left-sided osteophyte formation noted at the level of L2-L3. Multilevel facet joint hypertrophy is seen throughout the lumbar spine. Moderate to marked severity intervertebral disc space narrowing is seen at the levels of L1-L2 and L2-L3. Marked severity intervertebral disc space narrowing is seen at L4-L5 and L5-S1. IMPRESSION: Extensive multilevel degenerative changes throughout the lumbar spine, as  described above. Electronically Signed   By: Virgina Norfolk M.D.   On: 09/22/2022 18:21   DG HIP UNILAT WITH PELVIS 2-3 VIEWS LEFT  Result Date: 09/22/2022 CLINICAL DATA:  Back pain radiating to the leg EXAM: DG HIP (WITH OR WITHOUT PELVIS) 2-3V LEFT COMPARISON:  CT 11/17/2021 FINDINGS: SI joints are non widened. Pubic symphysis and rami appear intact. No fracture or malalignment. Joint space appears patent. Smooth oval calcification in the right pelvis corresponds to benign-appearing calcification on prior CT. IMPRESSION: Negative. Electronically Signed   By: Donavan Foil M.D.   On: 09/22/2022 16:48    Procedures Procedures    Medications Ordered in ED Medications  acetaminophen (TYLENOL) tablet 650 mg (650 mg Oral Given 09/22/22 1603)  dexamethasone (DECADRON) injection 8 mg (8 mg Intramuscular Given 09/22/22 1734)  oxyCODONE (Oxy IR/ROXICODONE) immediate release tablet 5 mg (5 mg Oral Given 09/22/22 1733)    ED Course/ Medical Decision Making/ A&P Clinical Course as of 09/22/22 1845  Sat Sep 22, 2022  1709 DG HIP UNILAT WITH PELVIS 2-3 VIEWS LEFT [JR]    Clinical Course User Index [JR] Harriet Pho, PA-C                             Medical Decision Making Amount and/or Complexity of Data Reviewed Radiology: ordered. Decision-making details documented in ED Course.  Risk OTC drugs. Prescription drug management.   82 year old female who is well-appearing and hemodynamically stable presenting for left buttock and left leg pain.  Exam was overall unremarkable, patient did endorse pain with ambulation but otherwise reassuring.  Given pain begins in the left buttock and feels sharp and radiates down to the foot, likely etiology is sciatic pain versus radiculopathy.  Doubt septic arthritis.  Also doubt dislocation or fracture of the hip given x-ray findings.  Treated with Decadron, oxycodone and Tylenol.  Discharged stable vitals.  Advised to follow-up with her PCP for what is  likely sciatic pain and possibly radiculopathy secondary to lumbar stenosis.  Advised to treat conservatively at home.        Final Clinical Impression(s) / ED Diagnoses Final diagnoses:  Sciatica of left side  DDD (degenerative disc disease), lumbar    Rx / DC Orders ED Discharge Orders     None         Harriet Pho, PA-C 09/22/22 1845    Noemi Chapel, MD 09/23/22 [phone]

## 2022-09-22 NOTE — ED Provider Triage Note (Signed)
Emergency Medicine Provider Triage Evaluation Note  Kim Schultz , a 82 y.o. female  was evaluated in triage.  Pt complains of back pain.  States she woke up this morning and immediately felt left-sided back pain primarily in the left upper buttock region down to the left foot.  States it feels sharp and is constant.  She has been afraid to walk today because of the pain and feels like she is going to fall if she walks on it.  Normally able to ambulate without assistance.  Denies fever, and chills.  Denies recent falls or trauma to her leg or back.  Review of Systems  Positive: See above Negative: See above  Physical Exam  BP 122/72   Pulse 93   Temp 98.1 F (36.7 C) (Oral)   Resp 18   Ht 5\' 7"  (1.702 m)   Wt 78 kg   SpO2 95%   BMI 26.94 kg/m  Gen:   Awake, no distress   Resp:  Normal effort  MSK:   Moves extremities without difficulty  Other:    Medical Decision Making  Medically screening exam initiated at 3:40 PM.  Appropriate orders placed.  Kim Schultz was informed that the remainder of the evaluation will be completed by another provider, this initial triage assessment does not replace that evaluation, and the importance of remaining in the ED until their evaluation is complete.  Work up started   Kim Pho, PA-C 09/22/22 1542

## 2022-10-16 ENCOUNTER — Ambulatory Visit (INDEPENDENT_AMBULATORY_CARE_PROVIDER_SITE_OTHER): Payer: Medicare Other | Admitting: Orthopaedic Surgery

## 2022-10-16 ENCOUNTER — Encounter: Payer: Self-pay | Admitting: Orthopaedic Surgery

## 2022-10-16 VITALS — BP 130/70 | HR 88 | Ht 67.0 in | Wt 170.0 lb

## 2022-10-16 DIAGNOSIS — M5410 Radiculopathy, site unspecified: Secondary | ICD-10-CM

## 2022-10-16 DIAGNOSIS — M5136 Other intervertebral disc degeneration, lumbar region: Secondary | ICD-10-CM | POA: Diagnosis not present

## 2022-10-16 DIAGNOSIS — M545 Low back pain, unspecified: Secondary | ICD-10-CM

## 2022-10-16 MED ORDER — CYCLOBENZAPRINE HCL 10 MG PO TABS: ORAL_TABLET | ORAL | 3 refills | Status: DC

## 2022-10-16 MED ORDER — AMOXICILLIN 500 MG PO TABS: 500.0000 mg | ORAL_TABLET | Freq: Two times a day (BID) | ORAL | 2 refills | Status: DC

## 2022-10-16 NOTE — Progress Notes (Signed)
My back hurts.  Her left elbow is better less pain.  She had an episode of lower back pain with left sided radiation. She went to the ER for this and I have reviewed the records and X-rays.  She was advised to have MRI.  She does not want a MRI.  I have recommended it also after reviewing her notes.  She declines.  Back is not that painful today.  Spine/Pelvis examination:  Inspection:  Overall, sacoiliac joint benign and hips nontender; without crepitus or defects.   Thoracic spine inspection: Alignment normal without kyphosis present   Lumbar spine inspection:  Alignment  with normal lumbar lordosis, without scoliosis apparent.   Thoracic spine palpation:  without tenderness of spinal processes   Lumbar spine palpation: without tenderness of lumbar area; without tightness of lumbar muscles    Range of Motion:   Lumbar flexion, forward flexion is normal without pain or tenderness    Lumbar extension is full without pain or tenderness   Left lateral bend is normal without pain or tenderness   Right lateral bend is normal without pain or tenderness   Straight leg raising is normal  Strength & tone: normal   Stability overall normal stability  Her left elbow has limited motion, chronic, NV intact.  I have independently reviewed and interpreted x-rays of this patient done at another site by another physician or qualified health professional.   Encounter Diagnoses  Name Primary?   DDD (degenerative disc disease), lumbar Yes   Lumbar pain    Radicular pain of left lower extremity    I will see her in six weeks.  If she would like to reconsider MRI, I can get before then.  I have refilled her Flexeril and also amoxicillin for her dental work.  Call if any problem.  Precautions discussed.  Electronically Signed Darreld Mclean, MD 4/9/20242:35 PM

## 2022-11-27 ENCOUNTER — Ambulatory Visit (INDEPENDENT_AMBULATORY_CARE_PROVIDER_SITE_OTHER): Payer: Medicare Other | Admitting: Orthopaedic Surgery

## 2022-11-27 ENCOUNTER — Encounter: Payer: Self-pay | Admitting: Orthopaedic Surgery

## 2022-11-27 VITALS — BP 138/81 | HR 83 | Ht 67.0 in | Wt 172.0 lb

## 2022-11-27 DIAGNOSIS — M25522 Pain in left elbow: Secondary | ICD-10-CM | POA: Diagnosis not present

## 2022-11-27 DIAGNOSIS — G8929 Other chronic pain: Secondary | ICD-10-CM

## 2022-11-27 DIAGNOSIS — G894 Chronic pain syndrome: Secondary | ICD-10-CM

## 2022-11-27 DIAGNOSIS — M5136 Other intervertebral disc degeneration, lumbar region: Secondary | ICD-10-CM

## 2022-11-27 NOTE — Progress Notes (Signed)
I feel better today.  She has less back pain over the last few weeks.  She has no new trauma, no falls. She is active.  Her left elbow is not hurting.  Spine/Pelvis examination:  Inspection:  Overall, sacoiliac joint benign and hips nontender; without crepitus or defects.   Thoracic spine inspection: Alignment normal without kyphosis present   Lumbar spine inspection:  Alignment  with normal lumbar lordosis, without scoliosis apparent.   Thoracic spine palpation:  without tenderness of spinal processes   Lumbar spine palpation: without tenderness of lumbar area; without tightness of lumbar muscles    Range of Motion:   Lumbar flexion, forward flexion is normal without pain or tenderness    Lumbar extension is full without pain or tenderness   Left lateral bend is normal without pain or tenderness   Right lateral bend is normal without pain or tenderness   Straight leg raising is normal  Strength & tone: normal   Stability overall normal stability  She has deformity and limited ROM of the left elbow.  NV intact.  Encounter Diagnoses  Name Primary?   DDD (degenerative disc disease), lumbar Yes   Elbow pain, chronic, left    Chronic pain syndrome    Return in two months.  Call if any problem.  Precautions discussed.  Electronically Signed Darreld Mclean, MD 5/21/20241:47 PM

## 2023-01-15 IMAGING — MR MR KNEE*L* W/O CM
7 series · 40 of 40 positions shown · non-contrast
Comparison: Left knee x-rays dated June 21, 2020.

CLINICAL DATA: Chronic left knee pain.

EXAM:
MRI OF THE LEFT KNEE WITHOUT CONTRAST
TECHNIQUE: Multiplanar, multisequence MR imaging of the knee was performed. No
intravenous contrast was administered.

[Series 9: T1 · coronal · left · 4.0mm · 0.66mm/px · 5 of 24 slices shown]
[im 1/24]
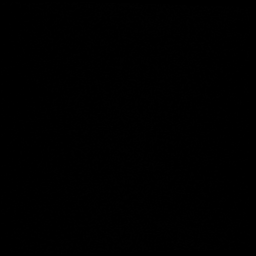
[im 6/24]
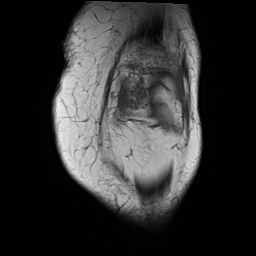
[im 12/24]
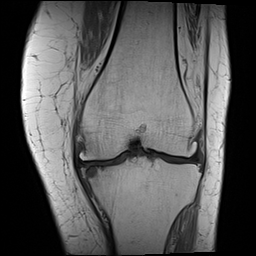
[im 18/24]
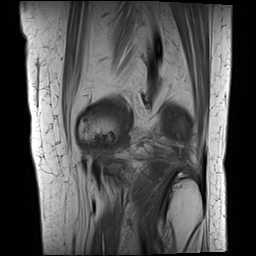
[im 24/24]
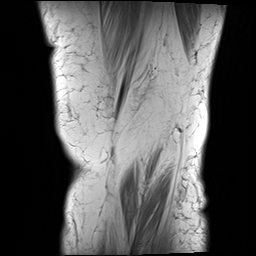

[Series 10: PD fat-sat · coronal · left · 3.0mm · 0.66mm/px · 7 of 30 slices shown (1 of 2)]
[im 1/30]
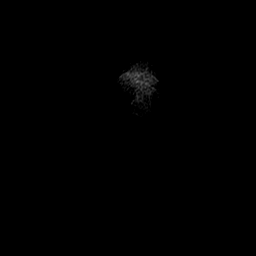
[im 5/30]
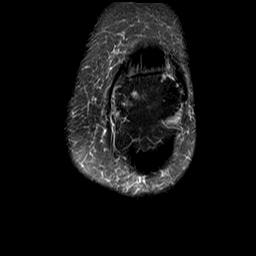
[im 10/30]
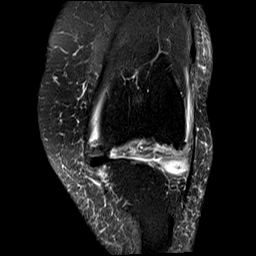
[im 15/30]
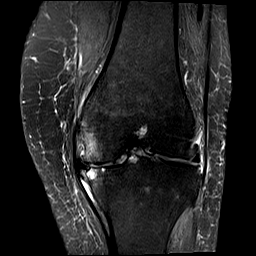
[im 20/30]
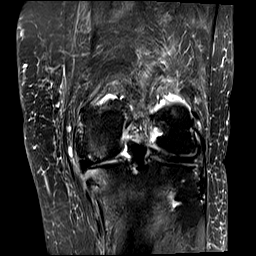
[im 25/30]
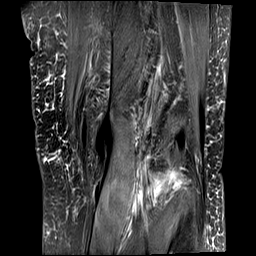
[im 30/30]
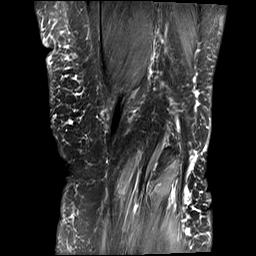

[Series 11: T2 fat-sat · coronal · left · 4.0mm · 0.66mm/px · 6 of 28 slices shown (1 of 3)]
[im 1/28]
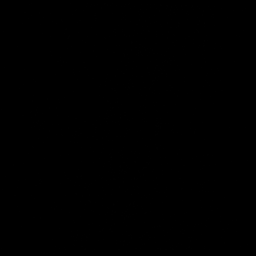
[im 6/28]
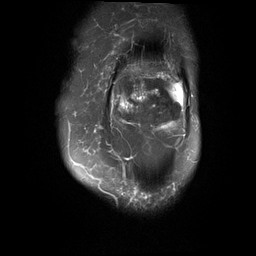
[im 11/28]
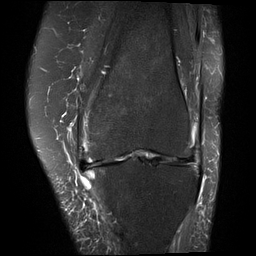
[im 17/28]
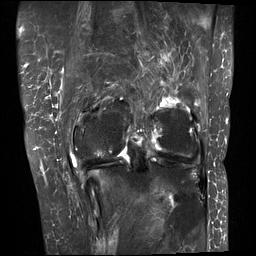
[im 22/28]
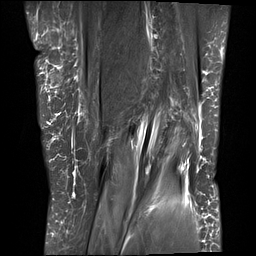
[im 28/28]
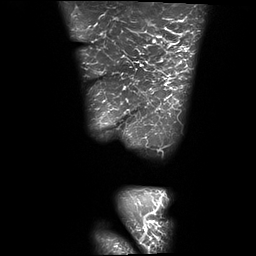

[Series 12: T2 fat-sat · axial · left · 4.0mm · 0.56mm/px · z∈[-77,+47]mm · 6 of 26 slices shown (2 of 3)]
[im 1/26]
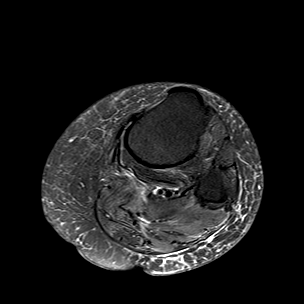
[im 6/26]
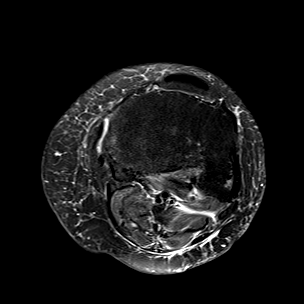
[im 11/26]
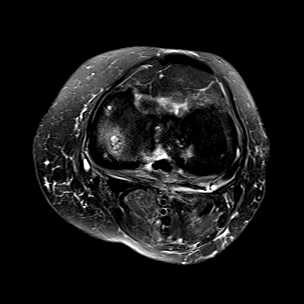
[im 16/26]
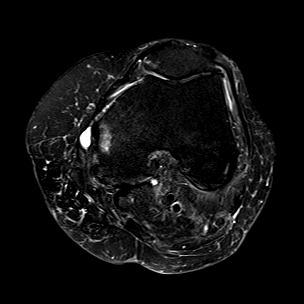
[im 21/26]
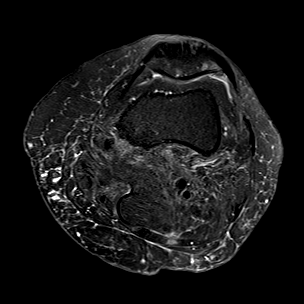
[im 26/26]
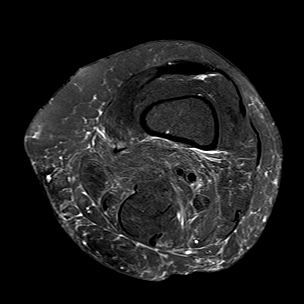

[Series 13: PD fat-sat · sagittal · left · 3.0mm · 0.66mm/px · 6 of 28 slices shown (2 of 2)]
[im 1/28]
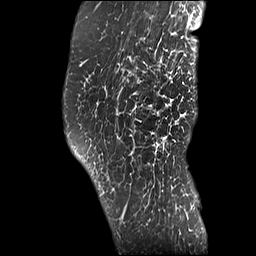
[im 6/28]
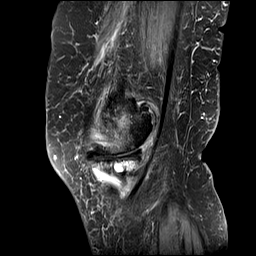
[im 11/28]
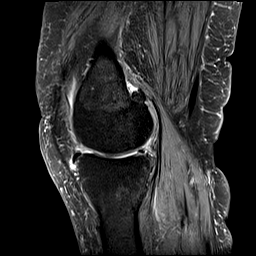
[im 17/28]
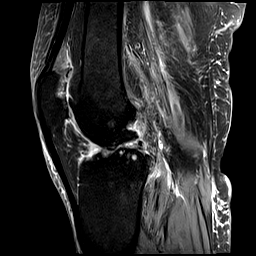
[im 22/28]
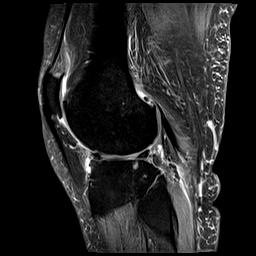
[im 28/28]
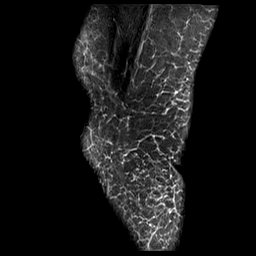

[Series 14: T2 fat-sat · sagittal · left · 3.0mm · 0.59mm/px · 6 of 28 slices shown (3 of 3)]
[im 1/28]
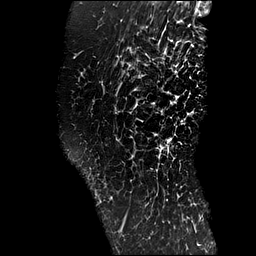
[im 6/28]
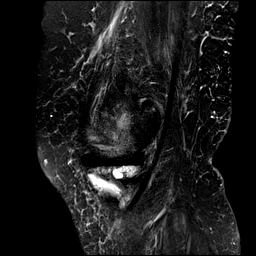
[im 11/28]
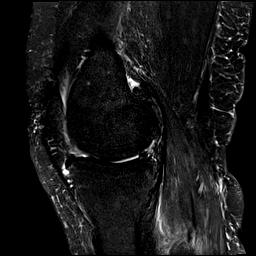
[im 17/28]
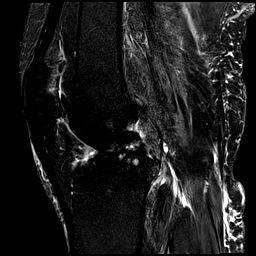
[im 22/28]
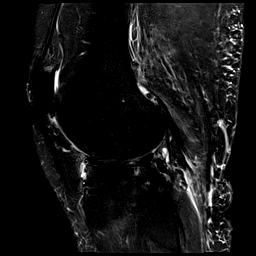
[im 28/28]
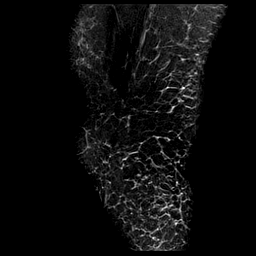

[Series 15: PD · coronal · left · 2.0mm · 0.53mm/px · 4 of 20 slices shown]
[im 1/20]
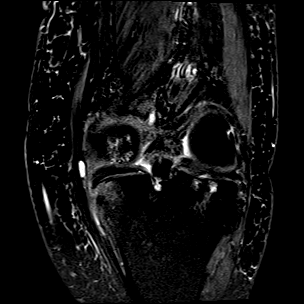
[im 7/20]
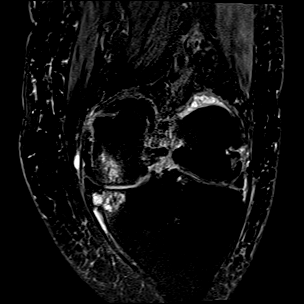
[im 13/20]
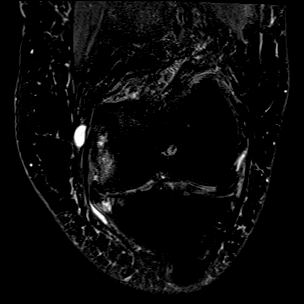
[im 20/20]
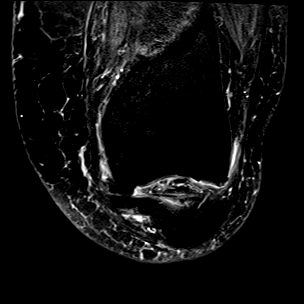

[40 of 40 positions shown; findings below may reference images not displayed]

FINDINGS: MENISCI

Medial meniscus:  Diminutive and torn posterior horn.

Lateral meniscus: Small radial tears of the anterior and posterior
horns. Additional small oblique undersurface tear of the posterior
horn.

LIGAMENTS

Cruciates:  Intact ACL and PCL.

Collaterals: Medial collateral ligament is intact. Lateral
collateral ligament complex is intact.

CARTILAGE

Patellofemoral: Extensive full-thickness cartilage loss over the
medial patellar facet and apex. High-grade partial-thickness
cartilage loss over the lateral patellar facet. High-grade partial
and scatter full-thickness cartilage loss over the trochlea.

Medial: Extensive full-thickness cartilage loss over the central and
posterior weight-bearing medial femoral condyle and central medial
tibial plateau with prominent subchondral cystic change and marrow
edema.

Lateral: Focal near full-thickness cartilage loss over the posterior
weight-bearing lateral femoral condyle and central lateral tibial
plateau with subchondral marrow edema and cystic change.

Joint:  No significant joint effusion.  Normal Hoffa's fat.

Popliteal Fossa:  No Baker cyst. Intact popliteus tendon.

Extensor Mechanism: Intact quadriceps tendon and patellar tendon.
Intact medial and lateral patellar retinaculum. Intact MPFL.

Bones: No acute fracture or dislocation. No suspicious bone lesion.
Bulky tricompartmental marginal osteophytes.

Other: 1.3 x 0.7 x 2.6 cm ganglion cyst adjacent to the proximal
MCL.
IMPRESSION: 1. Diminutive and torn medial meniscus posterior horn.
2. Small radial tears of the lateral meniscus anterior and posterior
horns. Additional small oblique undersurface tear of the posterior
horn.
3. Tricompartmental osteoarthritis, moderate to severe in the medial
and patellofemoral compartments.
4. 2.6 cm ganglion cyst adjacent to the proximal MCL.

## 2023-01-29 ENCOUNTER — Ambulatory Visit: Payer: Medicare Other | Admitting: Orthopaedic Surgery

## 2023-02-05 ENCOUNTER — Ambulatory Visit (INDEPENDENT_AMBULATORY_CARE_PROVIDER_SITE_OTHER): Payer: Medicare Other | Admitting: Orthopaedic Surgery

## 2023-02-05 ENCOUNTER — Encounter: Payer: Self-pay | Admitting: Orthopaedic Surgery

## 2023-02-05 VITALS — BP 132/83 | HR 106 | Ht 67.0 in | Wt 172.8 lb

## 2023-02-05 DIAGNOSIS — M25522 Pain in left elbow: Secondary | ICD-10-CM | POA: Diagnosis not present

## 2023-02-05 DIAGNOSIS — G894 Chronic pain syndrome: Secondary | ICD-10-CM | POA: Diagnosis not present

## 2023-02-05 DIAGNOSIS — M5136 Other intervertebral disc degeneration, lumbar region: Secondary | ICD-10-CM

## 2023-02-05 DIAGNOSIS — G8929 Other chronic pain: Secondary | ICD-10-CM

## 2023-02-05 MED ORDER — CYCLOBENZAPRINE HCL 10 MG PO TABS: ORAL_TABLET | ORAL | 3 refills | Status: DC

## 2023-02-05 NOTE — Progress Notes (Signed)
I am about the same.  She is doing well with her back.  She has good and bad days.  She has no new trauma, no weakness. She needs a refill on her Flexeril.    Lower back is tender but ROM is good, muscle tone and strength normal, gait normal, NV intact.  Left elbow with deformity and decreased ROM, NV intact.  Encounter Diagnoses  Name Primary?   DDD (degenerative disc disease), lumbar Yes   Elbow pain, chronic, left    Chronic pain syndrome    I will refill the Flexeril.  Return in three months.  Call if any problem.  Precautions discussed.  Electronically Signed Darreld Mclean, MD 7/30/20241:42 PM

## 2023-05-07 ENCOUNTER — Ambulatory Visit (INDEPENDENT_AMBULATORY_CARE_PROVIDER_SITE_OTHER): Payer: Medicare Other | Admitting: Orthopaedic Surgery

## 2023-05-07 ENCOUNTER — Encounter: Payer: Self-pay | Admitting: Orthopaedic Surgery

## 2023-05-07 VITALS — BP 146/80 | HR 90 | Ht 67.0 in | Wt 176.0 lb

## 2023-05-07 DIAGNOSIS — M545 Low back pain, unspecified: Secondary | ICD-10-CM | POA: Diagnosis not present

## 2023-05-07 DIAGNOSIS — G894 Chronic pain syndrome: Secondary | ICD-10-CM

## 2023-05-07 MED ORDER — CYCLOBENZAPRINE HCL 10 MG PO TABS: 10.0000 mg | ORAL_TABLET | Freq: Every day | ORAL | 3 refills | Status: AC

## 2023-05-07 NOTE — Progress Notes (Signed)
My back is tender  She has good and bad days with her back.  She is taking her medicine and being active.  She has no numbness.  Her left elbow is tender at times also.  She has no new trauma.  Spine/Pelvis examination:  Inspection:  Overall, sacoiliac joint benign and hips nontender; without crepitus or defects.   Thoracic spine inspection: Alignment normal without kyphosis present   Lumbar spine inspection:  Alignment  with normal lumbar lordosis, without scoliosis apparent.   Thoracic spine palpation:  without tenderness of spinal processes   Lumbar spine palpation: without tenderness of lumbar area; without tightness of lumbar muscles    Range of Motion:   Lumbar flexion, forward flexion is normal without pain or tenderness    Lumbar extension is full without pain or tenderness   Left lateral bend is normal without pain or tenderness   Right lateral bend is normal without pain or tenderness   Straight leg raising is normal  Strength & tone: normal   Stability overall normal stability  Elbow on left has deformity and decreased motion.  Encounter Diagnoses  Name Primary?   Lumbar pain Yes   Chronic pain syndrome    I will call in 90 day supply of Flexeril.  She is having difficulty in getting her Medicare Plan for next year.  I have given her information on how to do this.  I can do it for her in a few weeks if she does not have resolution.  Return in three months.  Call if any problem.  Precautions discussed.  Electronically Signed Darreld Mclean, MD 10/29/20241:49 PM

## 2023-05-29 ENCOUNTER — Telehealth: Payer: Self-pay | Admitting: Orthopaedic Surgery

## 2023-08-07 ENCOUNTER — Encounter: Payer: Self-pay | Admitting: Orthopaedic Surgery

## 2023-08-07 ENCOUNTER — Ambulatory Visit: Payer: Medicare Other | Admitting: Orthopaedic Surgery

## 2023-08-07 VITALS — BP 155/89 | HR 94 | Ht 67.0 in | Wt 174.0 lb

## 2023-08-07 DIAGNOSIS — G894 Chronic pain syndrome: Secondary | ICD-10-CM | POA: Diagnosis not present

## 2023-08-07 DIAGNOSIS — M25562 Pain in left knee: Secondary | ICD-10-CM

## 2023-08-07 DIAGNOSIS — G8929 Other chronic pain: Secondary | ICD-10-CM

## 2023-08-07 MED ORDER — METHYLPREDNISOLONE ACETATE 40 MG/ML IJ SUSP
40.0000 mg | Freq: Once | INTRAMUSCULAR | Status: AC
  Administered 2023-08-07: 40 mg via INTRA_ARTICULAR

## 2023-08-07 NOTE — Progress Notes (Signed)
PROCEDURE NOTE:  The patient requests injections of the left knee , verbal consent was obtained.  The left knee was prepped appropriately after time out was performed.   Sterile technique was observed and injection of 1 cc of DepoMedrol 40 mg with several cc's of plain xylocaine. Anesthesia was provided by ethyl chloride and a 20-gauge needle was used to inject the knee area. The injection was tolerated well.  A band aid dressing was applied.  The patient was advised to apply ice later today and tomorrow to the injection sight as needed.  Encounter Diagnoses  Name Primary?   Chronic pain of left knee Yes   Chronic pain syndrome    Return in three months.  Call if any problem.  Precautions discussed.  Electronically Signed Darreld Mclean, MD 1/29/20251:57 PM

## 2023-10-04 ENCOUNTER — Emergency Department (HOSPITAL_COMMUNITY)

## 2023-10-04 ENCOUNTER — Encounter (HOSPITAL_COMMUNITY): Payer: Self-pay | Admitting: *Deleted

## 2023-10-04 ENCOUNTER — Emergency Department (HOSPITAL_COMMUNITY)
Admission: EM | Admit: 2023-10-04 | Discharge: 2023-10-04 | Disposition: A | Discharge status: Home / Self Care | Attending: Emergency Medicine | Admitting: Emergency Medicine

## 2023-10-04 ENCOUNTER — Other Ambulatory Visit: Payer: Self-pay

## 2023-10-04 DIAGNOSIS — M25522 Pain in left elbow: Secondary | ICD-10-CM | POA: Diagnosis present

## 2023-10-04 DIAGNOSIS — M25561 Pain in right knee: Secondary | ICD-10-CM | POA: Diagnosis not present

## 2023-10-04 DIAGNOSIS — R519 Headache, unspecified: Secondary | ICD-10-CM | POA: Insufficient documentation

## 2023-10-04 DIAGNOSIS — W102XXA Fall (on)(from) incline, initial encounter: Secondary | ICD-10-CM | POA: Diagnosis not present

## 2023-10-04 DIAGNOSIS — W19XXXA Unspecified fall, initial encounter: Secondary | ICD-10-CM

## 2023-10-04 MED ORDER — OXYCODONE HCL 5 MG PO TABS: 2.5000 mg | ORAL_TABLET | Freq: Four times a day (QID) | ORAL | 0 refills | Status: AC | PRN

## 2023-10-04 MED ORDER — OXYCODONE HCL 5 MG PO TABS
5.0000 mg | ORAL_TABLET | Freq: Once | ORAL | Status: AC
  Administered 2023-10-04: 5 mg via ORAL
  Filled 2023-10-04: qty 1

## 2023-10-04 MED ORDER — ACETAMINOPHEN 500 MG PO TABS
1000.0000 mg | ORAL_TABLET | Freq: Once | ORAL | Status: AC
  Administered 2023-10-04: 1000 mg via ORAL
  Filled 2023-10-04: qty 2

## 2023-10-04 NOTE — ED Notes (Signed)
 Patient transported to CT

## 2023-10-04 NOTE — ED Triage Notes (Signed)
 The pt fell going up an incline approx 1400 today  she has a laceration to the bridge oh her nose   she reports that it was bleeding initially none now   rt knee  she was seen at urgent care and sent here  she is  not on blood thinners  no loc

## 2023-10-04 NOTE — ED Provider Notes (Signed)
 [place] EMERGENCY DEPARTMENT AT [facility] Provider Note   CSN: [id number] Arrival date & time: 10/04/23  1653     History Chief Complaint  Patient presents with   Fall    HPI Kim Schultz is a 83 y.o. female presenting for chief complaint of ground-level fall. Was walking into the [employer] when she lost her balance on a step falling forward hitting her face.  Did not guard her face on the fall. Did hit her left elbow and right knee as part of the fall.  No medication prior to arrival.  Does not use anticoagulation.  Patient's recorded medical, surgical, social, medication list and allergies were reviewed in the Snapshot window as part of the initial history.   Review of Systems   Review of Systems  Constitutional:  Negative for chills and fever.  HENT:  Negative for ear pain and sore throat.   Eyes:  Negative for pain and visual disturbance.  Respiratory:  Negative for cough and shortness of breath.   Cardiovascular:  Negative for chest pain and palpitations.  Gastrointestinal:  Negative for abdominal pain and vomiting.  Genitourinary:  Negative for dysuria and hematuria.  Musculoskeletal:  Negative for arthralgias and back pain.  Skin:  Negative for color change and rash.  Neurological:  Negative for seizures and syncope.  All other systems reviewed and are negative.   Physical Exam Updated Vital Signs BP (!) 140/70   Pulse 75   Temp 98.5 F (36.9 C) (Oral)   Resp 12   Ht 5\' 7"  (1.702 m)   Wt 78.9 kg   SpO2 100%   BMI 27.24 kg/m  Physical Exam Vitals and nursing note reviewed.  Constitutional:      General: She is not in acute distress.    Appearance: She is well-developed.  HENT:     Head: Normocephalic and atraumatic.  Eyes:     Conjunctiva/sclera: Conjunctivae normal.  Cardiovascular:     Rate and Rhythm: Normal rate and regular rhythm.     Heart sounds: No murmur heard. Pulmonary:     Effort: Pulmonary effort is normal. No  respiratory distress.     Breath sounds: Normal breath sounds.  Abdominal:     General: There is no distension.     Palpations: Abdomen is soft.     Tenderness: There is no abdominal tenderness. There is no right CVA tenderness or left CVA tenderness.  Musculoskeletal:        General: Tenderness present. No swelling. Normal range of motion.     Cervical back: Neck supple.  Skin:    General: Skin is warm and dry.  Neurological:     General: No focal deficit present.     Mental Status: She is alert and oriented to person, place, and time. Mental status is at baseline.     Cranial Nerves: No cranial nerve deficit.      ED Course/ Medical Decision Making/ A&P    Procedures Procedures   Medications Ordered in ED Medications  acetaminophen (TYLENOL) tablet 1,000 mg (1,000 mg Oral Given 10/04/23 1829)  oxyCODONE (Oxy IR/ROXICODONE) immediate release tablet 5 mg (5 mg Oral Given 10/04/23 2042)   Medical Decision Making:    Kim Schultz is a 83 y.o. female who presented to the ED today with a moderate mechanisma trauma, detailed above.    Additional history discussed with patient's family/caregivers.  Patient placed on continuous vitals and telemetry monitoring while in ED which was reviewed periodically.  Given this mechanism of trauma, a full physical exam was performed. Notably, patient was HDS in NAD.   Reviewed and confirmed nursing documentation for past medical history, family history, social history.    Initial Assessment/Plan:   This is a patient presenting with a moderate mechanism trauma.  As such, I have considered intracranial injuries including intracranial hemorrhage, intrathoracic injuries including blunt myocardial or blunt lung injury, blunt abdominal injuries including aortic dissection, bladder injury, spleen injury, liver injury and I have considered orthopedic injuries including extremity or spinal injury.  With the patient's presentation of moderate  mechanism trauma but an otherwise reassuring exam, patient warrants targeted evaluation for potential traumatic injuries. Will proceed with targeted evaluation for potential injuries. Will proceed with left elbow and right knee as well as head and cspine due to mechanism. Objective evaluation resulted with no acute pathology.   Final Reassessment and Plan:   Pain controlled on serial reassessments. Patient had requested escalation of her pain control therapy due to failure of Tylenol for adequate pain control.  She was able to ambulate after administration of oxycodone.  Discussed risk of falls and patient expressed understanding but still wanted to proceed with this therapy for more aggressive pain control.   Disposition:  I have considered need for hospitalization, however, considering all of the above, I believe this patient is stable for discharge at this time.  Patient/family educated about specific return precautions for given chief complaint and symptoms.  Patient/family educated about follow-up with PCP.    Patient/family expressed understanding of return precautions and need for follow-up. Patient spoken to regarding all imaging and laboratory results and appropriate follow up for these results. All education provided in verbal form with additional information in written form. Time was allowed for answering of patient questions. Patient discharged.    Emergency Department Medication Summary:   Medications  acetaminophen (TYLENOL) tablet 1,000 mg (1,000 mg Oral Given 10/04/23 1829)  oxyCODONE (Oxy IR/ROXICODONE) immediate release tablet 5 mg (5 mg Oral Given 10/04/23 2042)     Clinical Impression:  1. Fall, initial encounter      Discharge   Final Clinical Impression(s) / ED Diagnoses Final diagnoses:  Fall, initial encounter    Rx / DC Orders ED Discharge Orders          Ordered    oxyCODONE (ROXICODONE) 5 MG immediate release tablet  Every 6 hours PRN        10/04/23  2021              Glyn Ade, MD 10/04/23 2223

## 2023-11-06 ENCOUNTER — Ambulatory Visit: Payer: Medicare Other | Admitting: Orthopaedic Surgery

## 2024-02-13 ENCOUNTER — Other Ambulatory Visit (HOSPITAL_COMMUNITY): Payer: Self-pay

## 2024-02-13 DIAGNOSIS — M810 Age-related osteoporosis without current pathological fracture: Secondary | ICD-10-CM

## 2024-02-13 DIAGNOSIS — Z13820 Encounter for screening for osteoporosis: Secondary | ICD-10-CM

## 2024-04-02 ENCOUNTER — Encounter: Payer: Self-pay | Admitting: Orthopaedic Surgery

## 2024-04-02 ENCOUNTER — Ambulatory Visit: Admitting: Orthopaedic Surgery

## 2024-04-02 DIAGNOSIS — G894 Chronic pain syndrome: Secondary | ICD-10-CM

## 2024-04-02 DIAGNOSIS — M25562 Pain in left knee: Secondary | ICD-10-CM

## 2024-04-02 DIAGNOSIS — G8929 Other chronic pain: Secondary | ICD-10-CM

## 2024-04-02 DIAGNOSIS — M25522 Pain in left elbow: Secondary | ICD-10-CM | POA: Diagnosis not present

## 2024-04-02 MED ORDER — HYDROCODONE-ACETAMINOPHEN 5-325 MG PO TABS: ORAL_TABLET | ORAL | 0 refills | Status: AC

## 2024-04-02 MED ORDER — CYCLOBENZAPRINE HCL 10 MG PO TABS: ORAL_TABLET | ORAL | 5 refills | Status: AC

## 2024-04-02 NOTE — Progress Notes (Signed)
 I hurt myself in a fall in March  She fell at the [facility] in [place] on October 04, 2023.  She had a bloody nose and hurt her left knee.  She had no fractures. She was seen at [facility] ER.  Her left knee is tender at times, some days more than others.  It does not lock but swells and pops.  I have offered an MRI but she wants to think about it.  Her left elbow is not hurting that much today.  She has deformity there.  ROM of the left knee is 0 to 100, medial pain, crepitus, effusion, NV intact, slight limp left, no distal edema.  Encounter Diagnoses  Name Primary?   Chronic pain of left knee Yes   Chronic pain syndrome    Elbow pain, chronic, left    I have reviewed the [place]  Controlled Substance Reporting System web site prior to prescribing narcotic medicine for this patient.  I have refilled her Flexeril .  I have given names of possible new family doctors.  I have informed the patient I will be retiring from medical practice and from this office on April 09, 2024.  The patient has been offered continuing care with Dr. Margrette or Dr. Onesimo of this office.  The patient may choose another provider and the records will be forwarded after proper signature and notification.  Patient understands and agrees.  She wants to return prn.  Call if any problem.  Precautions discussed.  Electronically Signed Lemond Stable, MD 9/25/20252:52 PM

## 2024-04-17 IMAGING — CT CT ABD-PELV W/O CM
2 of 4 series · 16 of 46 positions shown, 18 images · non-contrast
Comparison: None Available.

CLINICAL DATA: Rectal bleeding.



[Series 2: axial st · axial · 0.92mm/px · z∈[-645,-220]mm · 13 of 97 slices shown, 15 images]
[im 6/97  soft-tissue]
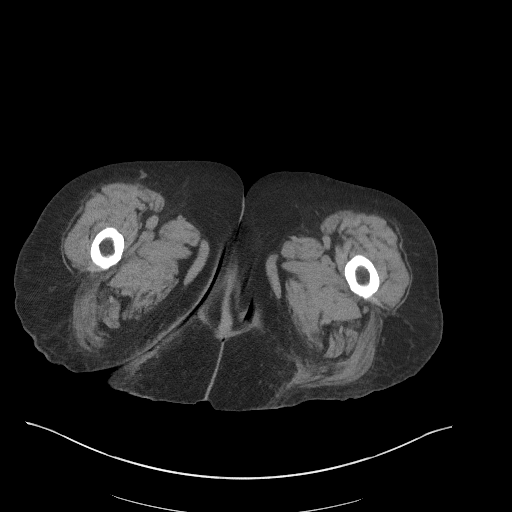
[im 6/97  bone]
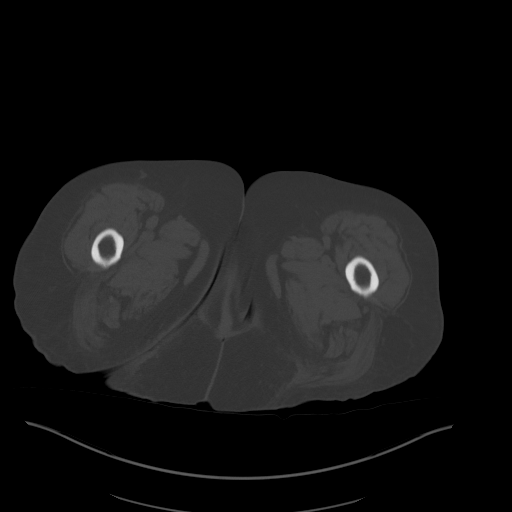
[im 16/97  soft-tissue]
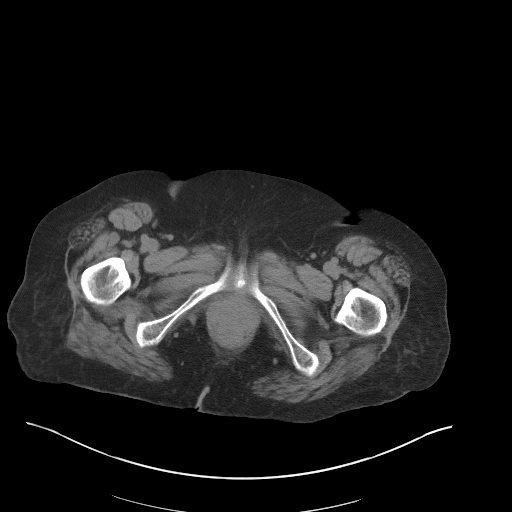
[im 21/97  soft-tissue]
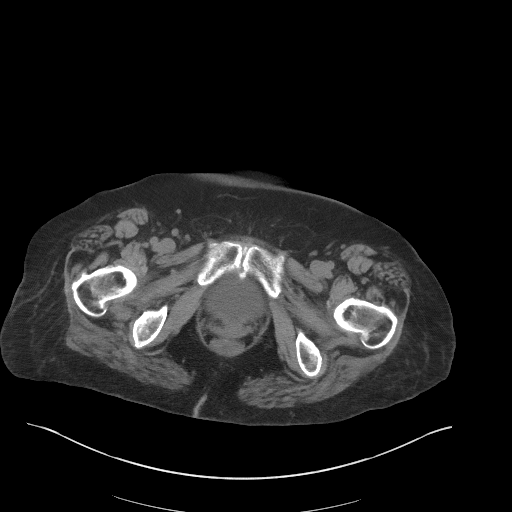
[im 26/97  soft-tissue]
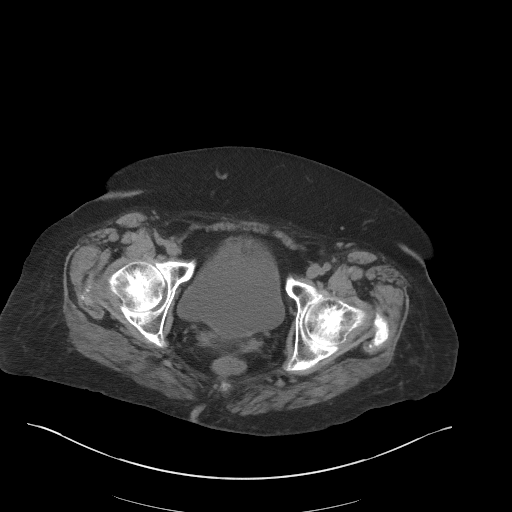
[im 36/97  soft-tissue]
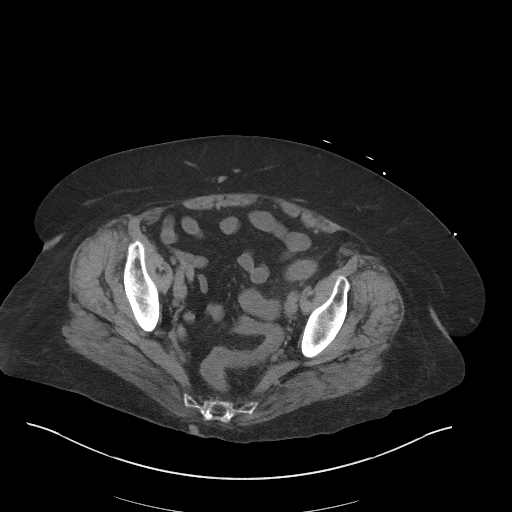
[im 41/97  soft-tissue]
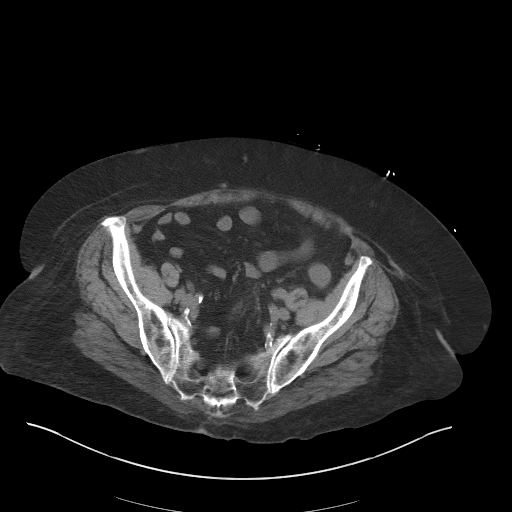
[im 51/97  soft-tissue]
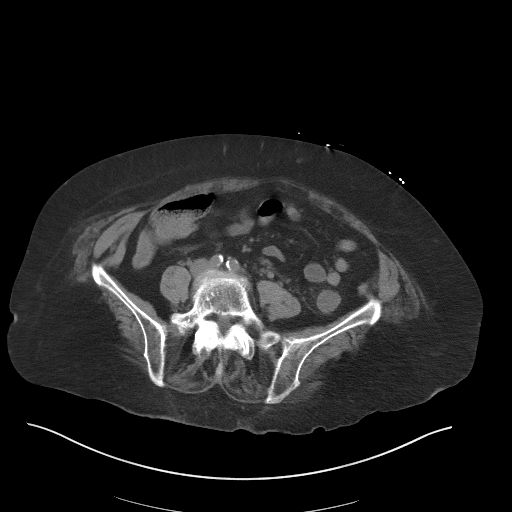
[im 56/97  soft-tissue]
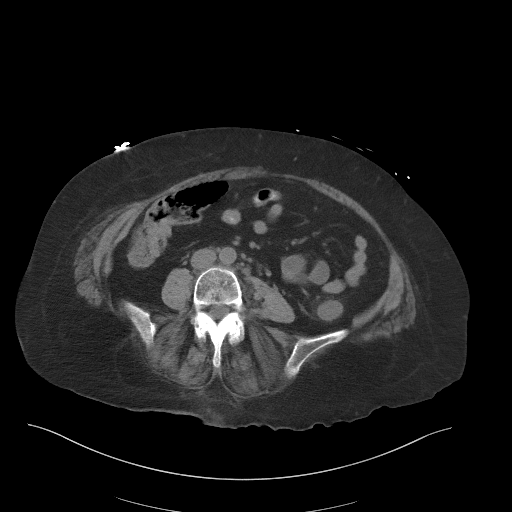
[im 61/97  soft-tissue]
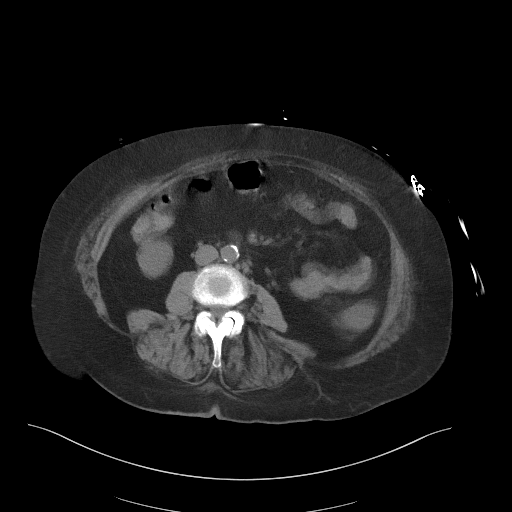
[im 61/97  bone]
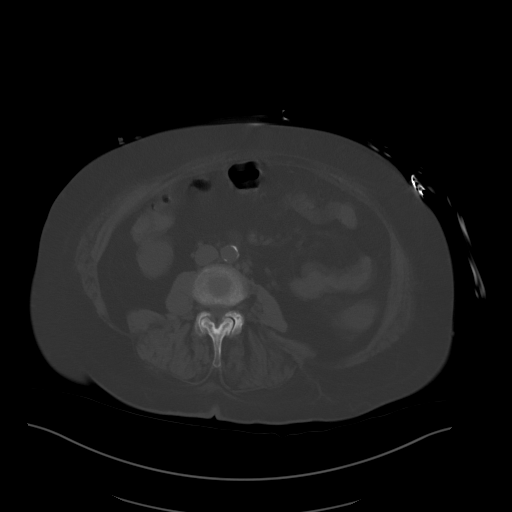
[im 71/97  soft-tissue]
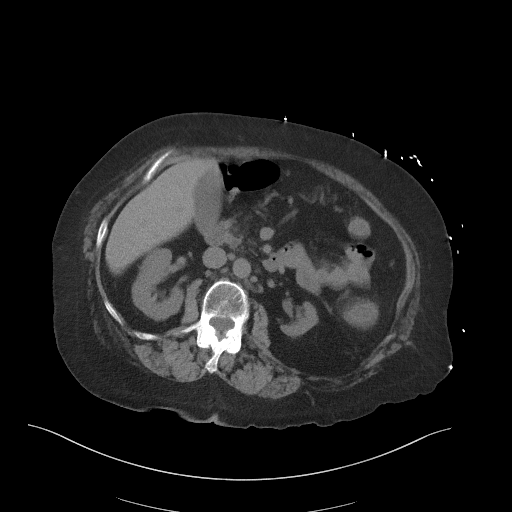
[im 76/97  soft-tissue]
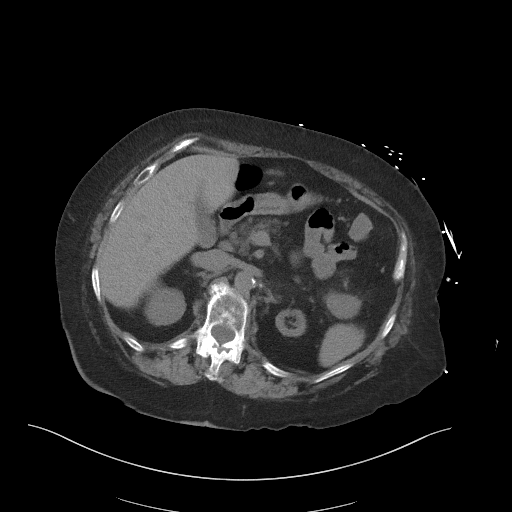
[im 81/97  soft-tissue]
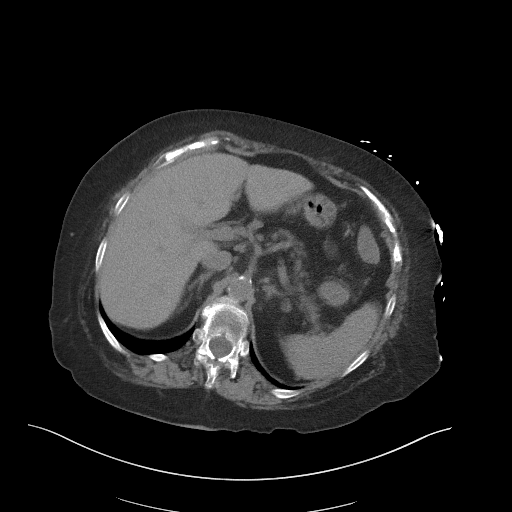
[im 91/97  soft-tissue]
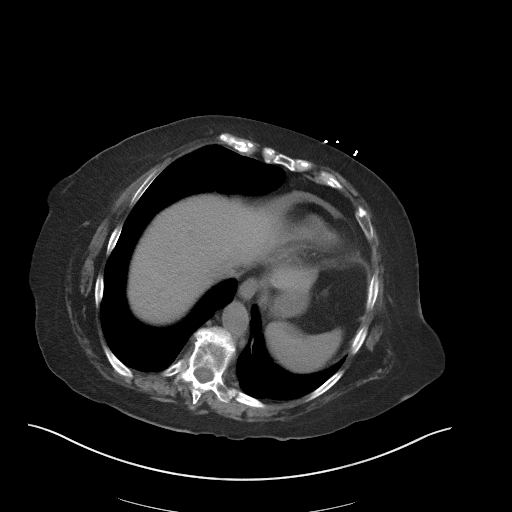

[Series 5: coronal st · coronal · 0.84mm/px · 3 of 119 slices shown]
[im 40/119  soft-tissue]
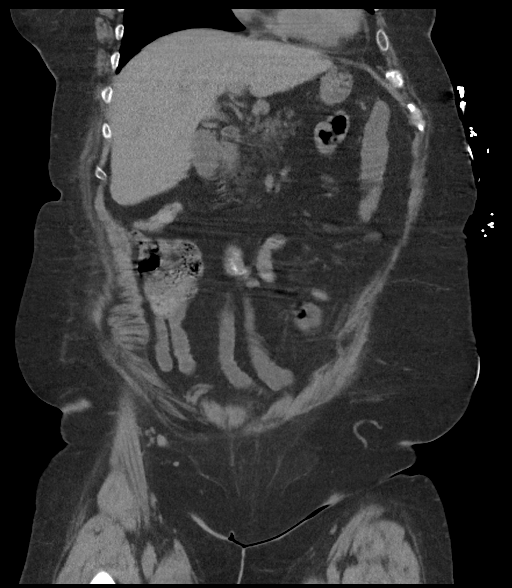
[im 53/119  soft-tissue]
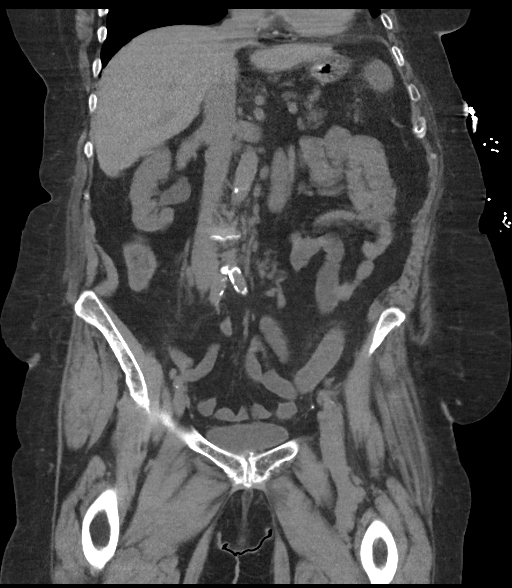
[im 66/119  soft-tissue]
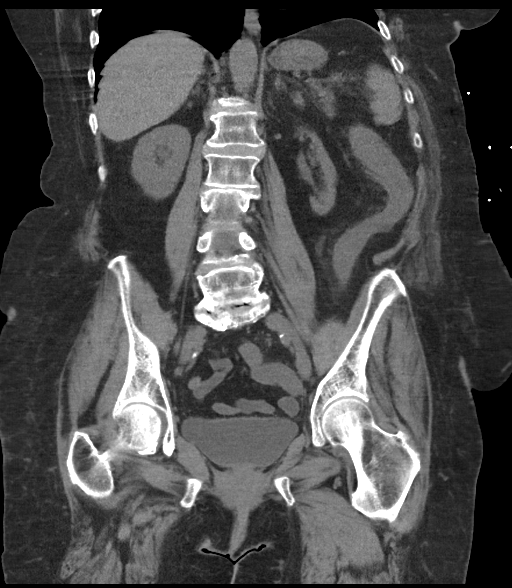

[16 of 46 positions shown; findings below may reference images not displayed]

FINDINGS: Lower chest: No acute abnormality.

Hepatobiliary: No focal liver abnormality is seen. No gallstones,
gallbladder wall thickening, or biliary dilatation.

Pancreas: There is diffuse fatty infiltration of the pancreatic
parenchyma. No pancreatic ductal dilatation or surrounding
inflammatory changes.

Spleen: Normal in size without focal abnormality.

Adrenals/Urinary Tract: Adrenal glands are unremarkable. Left kidney
is atrophic in appearance. The right kidney is normal in size,
without renal calculi or focal lesions. There is mild right-sided
hydronephrosis. Bladder is unremarkable.

Stomach/Bowel: Stomach is within normal limits. The appendix is not
clearly identified. No evidence of dilatation. Mild diffuse
thickening of the descending and sigmoid colon is seen with a mild
amount of pericolonic inflammatory fat stranding.

Vascular/Lymphatic: Aortic atherosclerosis. No enlarged abdominal or
pelvic lymph nodes.

Reproductive: Status post hysterectomy. No adnexal masses.

Other: No abdominal wall hernia or abnormality. No abdominopelvic
ascites.

Musculoskeletal: Multilevel degenerative changes seen throughout the
lumbar spine.
IMPRESSION: 1. Mild colitis involving the descending and sigmoid colon.
2. Mild right-sided hydronephrosis without evidence of renal
calculi.
3. Atrophic left kidney.
4. Aortic atherosclerosis.

Aortic Atherosclerosis ([id number]).

## 2024-04-17 IMAGING — DX DG CHEST 1V PORT
1 series · 1 of 1 positions shown · non-contrast
Comparison: July 05, 2016

CLINICAL DATA: Shortness of breath and rectal bleeding.

EXAM:
PORTABLE CHEST 1 VIEW

[chest ap]
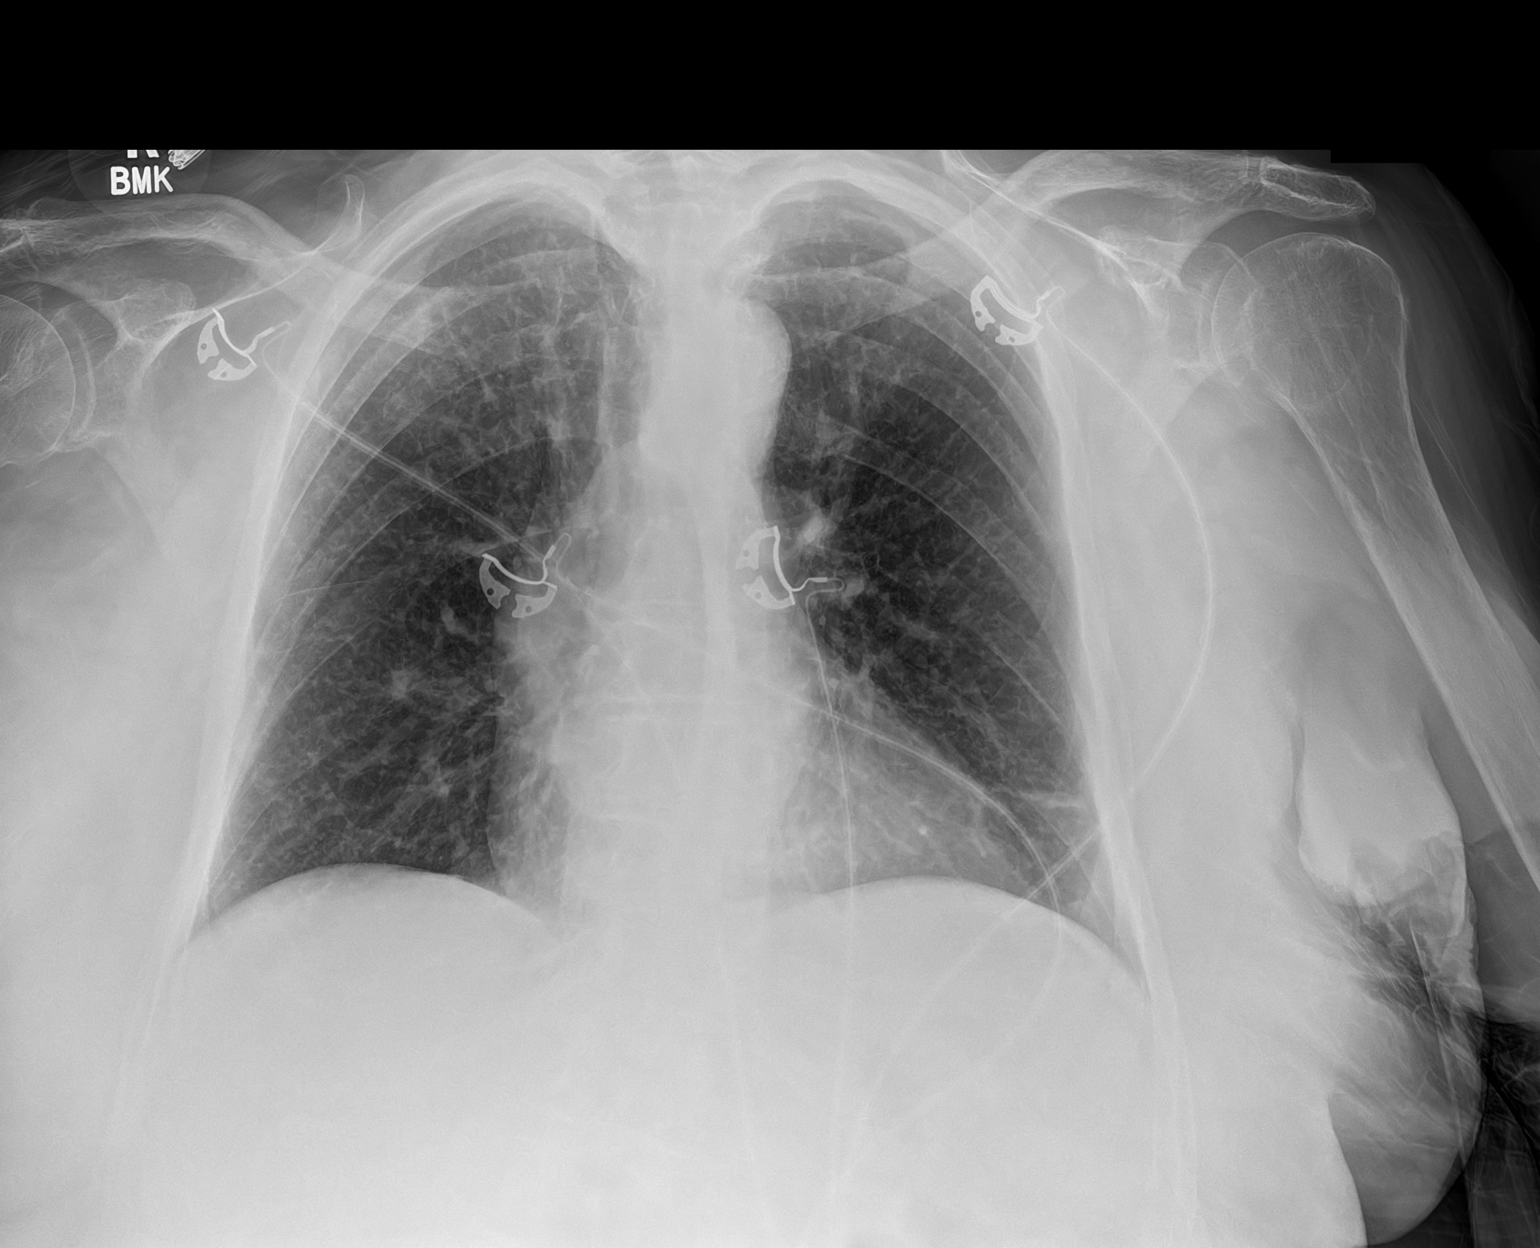

[1 of 1 positions shown; findings below may reference images not displayed]

FINDINGS: The heart size and mediastinal contours are within normal limits.
Mild linear atelectasis is seen within the left lung base. There is
no evidence of acute infiltrate, pleural effusion or pneumothorax.
The visualized skeletal structures are unremarkable.
IMPRESSION: No acute cardiopulmonary disease.

## 2024-04-23 ENCOUNTER — Encounter (HOSPITAL_COMMUNITY): Payer: Self-pay

## 2024-04-23 ENCOUNTER — Other Ambulatory Visit: Payer: Self-pay

## 2024-04-23 ENCOUNTER — Emergency Department (HOSPITAL_COMMUNITY)
Admission: EM | Admit: 2024-04-23 | Discharge: 2024-04-24 | Disposition: A | Discharge status: Home / Self Care | Attending: Emergency Medicine | Admitting: Emergency Medicine

## 2024-04-23 ENCOUNTER — Emergency Department (HOSPITAL_COMMUNITY)

## 2024-04-23 DIAGNOSIS — M25551 Pain in right hip: Secondary | ICD-10-CM | POA: Diagnosis not present

## 2024-04-23 DIAGNOSIS — M25561 Pain in right knee: Secondary | ICD-10-CM | POA: Diagnosis not present

## 2024-04-23 DIAGNOSIS — Z79899 Other long term (current) drug therapy: Secondary | ICD-10-CM | POA: Insufficient documentation

## 2024-04-23 DIAGNOSIS — R269 Unspecified abnormalities of gait and mobility: Secondary | ICD-10-CM | POA: Diagnosis not present

## 2024-04-23 DIAGNOSIS — M79602 Pain in left arm: Secondary | ICD-10-CM | POA: Insufficient documentation

## 2024-04-23 DIAGNOSIS — M25532 Pain in left wrist: Secondary | ICD-10-CM | POA: Insufficient documentation

## 2024-04-23 DIAGNOSIS — S8992XA Unspecified injury of left lower leg, initial encounter: Secondary | ICD-10-CM | POA: Diagnosis present

## 2024-04-23 DIAGNOSIS — M25512 Pain in left shoulder: Secondary | ICD-10-CM | POA: Insufficient documentation

## 2024-04-23 DIAGNOSIS — S8002XA Contusion of left knee, initial encounter: Secondary | ICD-10-CM | POA: Insufficient documentation

## 2024-04-23 DIAGNOSIS — M545 Low back pain, unspecified: Secondary | ICD-10-CM | POA: Insufficient documentation

## 2024-04-23 DIAGNOSIS — W19XXXA Unspecified fall, initial encounter: Secondary | ICD-10-CM | POA: Insufficient documentation

## 2024-04-23 LAB — CBC WITH DIFFERENTIAL/PLATELET
Abs Immature Granulocytes: 0.04 K/uL (ref 0.00–0.07)
Basophils Absolute: 0 K/uL (ref 0.0–0.1)
Basophils Relative: 0 %
Eosinophils Absolute: 0 K/uL (ref 0.0–0.5)
Eosinophils Relative: 0 %
HCT: 38 % (ref 36.0–46.0)
Hemoglobin: 12.3 g/dL (ref 12.0–15.0)
Immature Granulocytes: 0 %
Lymphocytes Relative: 13 %
Lymphs Abs: 1.3 K/uL (ref 0.7–4.0)
MCH: 32.9 pg (ref 26.0–34.0)
MCHC: 32.4 g/dL (ref 30.0–36.0)
MCV: 101.6 fL — ABNORMAL HIGH (ref 80.0–100.0)
Monocytes Absolute: 0.9 K/uL (ref 0.1–1.0)
Monocytes Relative: 9 %
Neutro Abs: 8.1 K/uL — ABNORMAL HIGH (ref 1.7–7.7)
Neutrophils Relative %: 78 %
Platelets: 198 K/uL (ref 150–400)
RBC: 3.74 MIL/uL — ABNORMAL LOW (ref 3.87–5.11)
RDW: 11.9 % (ref 11.5–15.5)
WBC: 10.5 K/uL (ref 4.0–10.5)
nRBC: 0 % (ref 0.0–0.2)

## 2024-04-23 MED ORDER — FENTANYL CITRATE (PF) 100 MCG/2ML IJ SOLN
50.0000 ug | Freq: Once | INTRAMUSCULAR | Status: AC
  Administered 2024-04-23: 50 ug via INTRAVENOUS
  Filled 2024-04-23: qty 2

## 2024-04-23 MED ORDER — SODIUM CHLORIDE 0.9 % IV BOLUS
500.0000 mL | Freq: Once | INTRAVENOUS | Status: AC
  Administered 2024-04-23: 500 mL via INTRAVENOUS

## 2024-04-23 NOTE — ED Triage Notes (Signed)
 Pt fell yesterday around 4:30 and didn't want to come to hospital until today. loss of consciousness. No blood thinners. Pt unable to ambulate. Left knee swollen and scraped. Complaining of left elbow pain. No head, neck, or back pain. Last BP 137/89, 97 RA, HR 122, CBG 123.

## 2024-04-23 NOTE — ED Provider Notes (Signed)
 [place] [facility] AT [facility] Provider Note   CSN: [id number] Arrival date & time: 04/23/24  2040     Patient presents with: Kim Schultz is a 83 y.o. female.  She is brought in by ambulance for evaluation of injuries from a fall.  She lives at home independently alone.  She fell Tuesday night and could not get up off the floor.  Ultimately family found her the next day and was able to get her up.  Since then she has been unable to ambulate.  Complaining of pain in both of her knees and her right hip, low back pain, left arm pain.  History of left arm elbow fractures.   The history is provided by the patient and a relative.  Fall This is a new problem. The current episode started 2 days ago. The problem has not changed since onset.Pertinent negatives include no chest pain, no abdominal pain, no headaches and no shortness of breath. The symptoms are aggravated by standing and walking. Nothing relieves the symptoms. She has tried rest for the symptoms. The treatment provided no relief.       Prior to Admission medications   Medication Sig Start Date End Date Taking? Authorizing Provider  ALPRAZolam  (XANAX ) 0.5 MG tablet Take 0.5 mg by mouth 2 (two) times daily as needed for anxiety.    [provider]  cyclobenzaprine  (FLEXERIL ) 10 MG tablet Take 1 tablet (10 mg total) by mouth at bedtime. One tablet every night at bedtime as needed for spasm. 05/07/23   Brenna Lin, MD  cyclobenzaprine  (FLEXERIL ) 10 MG tablet One at bedtime as needed for spasm. 04/02/24   Brenna Lin, MD  dicyclomine  (BENTYL ) 10 MG capsule Take 10 mg by mouth as needed for spasms.    [provider]  HYDROcodone -acetaminophen  (NORCO/VICODIN) 5-325 MG tablet One tablet every four hours for pain. 04/02/24   Brenna Lin, MD  levothyroxine  (SYNTHROID ) 88 MCG tablet Take 88 mcg by mouth every morning. 10/04/21   [provider]  lisinopril  (ZESTRIL ) 40 MG  tablet Take 0.5 tablets (20 mg total) by mouth daily. 11/20/21   Ricky Fines, MD  omeprazole  (PRILOSEC) 20 MG capsule Take 1 capsule (20 mg total) by mouth daily. 03/27/19   Ricky Fines, MD  oxyCODONE  (ROXICODONE ) 5 MG immediate release tablet Take 0.5 tablets (2.5 mg total) by mouth every 6 (six) hours as needed for severe pain (pain score 7-10). 10/04/23   Jerral Meth, MD  zolpidem  (AMBIEN ) 10 MG tablet Take 10 mg by mouth at bedtime as needed for sleep.    [provider]    Allergies: Naprosyn [naproxen]    Review of Systems  Constitutional:  Negative for fever.  HENT:  Negative for sore throat.   Respiratory:  Negative for shortness of breath.   Cardiovascular:  Negative for chest pain.  Gastrointestinal:  Negative for abdominal pain.  Genitourinary:  Negative for dysuria.  Musculoskeletal:  Positive for back pain and gait problem.  Neurological:  Negative for headaches.    Updated Vital Signs BP 123/81   Pulse (!) 110   Temp 98.1 F (36.7 C) (Oral)   Resp 18   Ht 5' 7 (1.702 m)   Wt 78.5 kg   SpO2 93%   BMI 27.10 kg/m   Physical Exam Vitals and nursing note reviewed.  Constitutional:      General: She is not in acute distress.    Appearance: Normal appearance. She is well-developed.  HENT:     Head: Normocephalic and atraumatic.  Eyes:     Conjunctiva/sclera: Conjunctivae normal.  Neck:     Comments: In cervical collar trach midline Cardiovascular:     Rate and Rhythm: Normal rate and regular rhythm.     Heart sounds: No murmur heard. Pulmonary:     Effort: Pulmonary effort is normal. No respiratory distress.     Breath sounds: Normal breath sounds. No stridor. No wheezing.  Abdominal:     Palpations: Abdomen is soft.     Tenderness: There is no abdominal tenderness. There is no guarding or rebound.  Musculoskeletal:        General: Tenderness and deformity present.     Comments: Right upper extremity full range of motion without any pain  or limitations.  Left upper extremity has obvious deformity of left elbow although looks old.  Mild tenderness.  Shoulder and wrist nontender.  Right lower extremity has pain over her knee and hip.  Left lower extremity pain over knee.  Some bruising and abrasions.  Distal pulses motor and sensation intact  Skin:    General: Skin is warm and dry.     Findings: Bruising present.  Neurological:     General: No focal deficit present.     Mental Status: She is alert.     GCS: GCS eye subscore is 4. GCS verbal subscore is 5. GCS motor subscore is 6.     Cranial Nerves: No cranial nerve deficit.     Sensory: No sensory deficit.     Motor: No weakness.     (all labs ordered are listed, but only abnormal results are displayed) Labs Reviewed  CBC WITH DIFFERENTIAL/PLATELET - Abnormal; Notable for the following components:      Result Value   RBC 3.74 (*)    MCV 101.6 (*)    Neutro Abs 8.1 (*)    All other components within normal limits  URINALYSIS, ROUTINE W REFLEX MICROSCOPIC - Abnormal; Notable for the following components:   Hgb urine dipstick SMALL (*)    Ketones, ur 5 (*)    All other components within normal limits  BASIC METABOLIC PANEL WITH GFR - Abnormal; Notable for the following components:   Sodium 132 (*)    CO2 19 (*)    Creatinine, Ser 1.44 (*)    GFR, Estimated 36 (*)    All other components within normal limits  CK - Abnormal; Notable for the following components:   Total CK 369 (*)    All other components within normal limits    EKG: EKG Interpretation Date/Time:  Thursday April 23 2024 22:22:37 EDT Ventricular Rate:  103 PR Interval:  177 QRS Duration:  101 QT Interval:  346 QTC Calculation: 453 R Axis:   -58  Text Interpretation: Sinus tachycardia Left anterior fascicular block Left ventricular hypertrophy Anterior Q waves, possibly due to LVH ST elevation suggests acute pericarditis No significant change since prior 3/25 Confirmed by Towana Sharper  [phone]) on 04/23/2024 10:30:52 PM  Radiology: DG Chest 1 View Result Date: 04/23/2024 EXAM: 1 VIEW(S) XRAY OF THE CHEST 04/23/2024 11:20:00 PM COMPARISON: 11/17/2021 CLINICAL HISTORY: fall. Fell earlier this evening pain on LT elbow (???past s/x.), contusion on LT knee region, RT knee pain and RT hip pain/discomfort from her fall FINDINGS: LUNGS AND PLEURA: No focal pulmonary opacity. No pulmonary edema. No pleural effusion. No pneumothorax. HEART AND MEDIASTINUM: Aortic calcification. Stable mild cardiomegaly. BONES AND SOFT TISSUES: No acute osseous abnormality. IMPRESSION: 1.  No acute cardiopulmonary abnormality. Electronically signed by: Pinkie Pebbles MD 04/23/2024 11:45 PM EDT RP Workstation: [id number]   DG Knee Complete 4 Views Right Result Date: 04/23/2024 EXAM: 4 OR MORE VIEW(S) XRAY OF THE RIGHT KNEE 04/23/2024 11:20:00 PM COMPARISON: None available. CLINICAL HISTORY: fall pain. FINDINGS: BONES AND JOINTS: The bones are diffusely osteopenic. There is no acute fracture or dislocation identified. No focal osseous lesion. No significant joint effusion. 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 There is tricompartmental mild degenerative change with joint space narrowing and osteophyte formation. SOFT TISSUES: The soft tissues are unremarkable. IMPRESSION: 1. No acute fracture or dislocation. 2. Tricompartmental mild degenerative change. Electronically signed by: Greig Pique MD 04/23/2024 11:45 PM EDT RP Workstation: [id number]   DG Elbow Complete Left Result Date: 04/23/2024 EXAM: 3 VIEW(S) XRAY OF THE LEFT ELBOW COMPARISON: 10/04/2023 CLINICAL HISTORY: fall pain. Fell earlier this evening pain on LT elbow (???past s/x.), contusion on LT knee region, RT knee pain and RT hip pain/discomfort from her fall FINDINGS: BONES AND JOINTS: Stable postsurgical changes involving the elbow with pinning of the proximal ulna. Stable resorption of the radial head. Stable chronic  posttraumatic deformity involving the olecranon and distal humerus. Chronic elbow dislocation. No acute fracture. No focal osseous lesion. No joint effusion. SOFT TISSUES: The soft tissues are unremarkable. IMPRESSION: 1. No acute abnormality. 2. Stable chronic postsurgical and post-traumatic deformity, as above. Electronically signed by: Pinkie Pebbles MD 04/23/2024 11:44 PM EDT RP Workstation: [id number]   DG Hip Unilat With Pelvis 2-3 Views Right Result Date: 04/23/2024 EXAM: 3 VIEW(S) XRAY OF THE RIGHT HIP 04/23/2024 11:20:00 PM COMPARISON: None available. CLINICAL HISTORY: fall pain. Fell earlier this evening pain on LT elbow (???past s/x.), contusion on LT knee region, RT knee pain and RT hip pain/discomfort from her fall FINDINGS: BONES AND JOINTS: No acute fracture or focal osseous lesion. The hip joint is maintained. No significant degenerative changes. LUMBAR SPINE: Degenerative changes of the visualized lower lumbar spine. SOFT TISSUES: The soft tissues are unremarkable. IMPRESSION: 1. No acute fracture or dislocation. Electronically signed by: Pinkie Pebbles MD 04/23/2024 11:42 PM EDT RP Workstation: [id number]   DG Knee Complete 4 Views Left Result Date: 04/23/2024 EXAM: 4 VIEW(S) XRAY OF THE LEFT KNEE 04/23/2024 11:20:00 PM COMPARISON: None available. CLINICAL HISTORY: fall pain. Fell earlier this evening pain on LT elbow (???past s/x.), contusion on LT knee region, RT knee pain and RT hip pain/discomfort from her fall FINDINGS: BONES AND JOINTS: No acute fracture. No focal osseous lesion. No joint dislocation. No significant joint effusion. Moderate 3 compartmental osteoarthritis with joint space narrowing and osteophyte formation. SOFT TISSUES: The soft tissues are unremarkable. IMPRESSION: 1. No acute fracture or dislocation. 2. Moderate tricompartmental osteoarthritis. Electronically signed by: Pinkie Pebbles MD 04/23/2024 11:40 PM EDT RP Workstation: [id number]   CT Thoracic Spine  Wo Contrast Result Date: 04/23/2024 EXAM: CT THORACIC SPINE WITHOUT CONTRAST 04/23/2024 11:09:09 PM TECHNIQUE: CT of the thoracic spine was performed without the administration of intravenous contrast. Multiplanar reformatted images are provided for review. Automated exposure control, iterative reconstruction, and/or weight based adjustment of the mA/kV was utilized to reduce the radiation dose to as low as reasonably achievable. COMPARISON: None available. CLINICAL HISTORY: Spine fracture, thoracic, traumatic. Pt fell yesterday around 4:30 and didn't want to come to hospital until today. Loss of consciousness. No blood thinners. Pt unable to ambulate. Left knee swollen and scraped. Complaining of left elbow pain. No head, neck, or back pain. FINDINGS: BONES AND ALIGNMENT: Exaggerated  thoracic kyphosis. Normal vertebral body heights. No acute fracture or suspicious bone lesion. DEGENERATIVE CHANGES: Mild degenerative changes of the lower thoracic spine. SOFT TISSUES: Thoracic aortic atherosclerosis. Moderate 3-vessel coronary atherosclerosis. IMPRESSION: 1. No acute abnormality of the thoracic spine. Electronically signed by: Pinkie Pebbles MD 04/23/2024 11:15 PM EDT RP Workstation: [id number]   CT Lumbar Spine Wo Contrast Result Date: 04/23/2024 EXAM: CT OF THE LUMBAR SPINE WITHOUT CONTRAST 04/23/2024 11:09:09 PM TECHNIQUE: CT of the lumbar spine was performed without the administration of intravenous contrast. Multiplanar reformatted images are provided for review. Automated exposure control, iterative reconstruction, and/or weight based adjustment of the mA/kV was utilized to reduce the radiation dose to as low as reasonably achievable. COMPARISON: None available. CLINICAL HISTORY: Lumbar radiculopathy, trauma. Pt fell yesterday around 4:30 and didn't want to come to hospital until today. loss of consciousness. No blood thinners. Pt unable to ambulate. Left knee swollen and scraped. Complaining of left  elbow pain. No head, neck, or back pain. FINDINGS: BONES AND ALIGNMENT: Normal vertebral body heights. No acute fracture or suspicious bone lesion. Normal alignment. DEGENERATIVE CHANGES: Mild degenerative changes of the lumbar spine, most prominent at L5-S1. SOFT TISSUES: Vascular calcifications. Left renal cortical atrophy. IMPRESSION: 1. No evidence of acute traumatic injury. Electronically signed by: Pinkie Pebbles MD 04/23/2024 11:15 PM EDT RP Workstation: [id number]   CT Cervical Spine Wo Contrast Result Date: 04/23/2024 EXAM: CT CERVICAL SPINE WITHOUT CONTRAST 04/23/2024 11:09:09 PM TECHNIQUE: CT of the cervical spine was performed without the administration of intravenous contrast. Multiplanar reformatted images are provided for review. Automated exposure control, iterative reconstruction, and/or weight based adjustment of the mA/kV was utilized to reduce the radiation dose to as low as reasonably achievable. COMPARISON: None available. CLINICAL HISTORY: Neck trauma (Age >= 65y). Pt fell yesterday around 4: 30 and didn't want to come to hospital until today. loss of consciousness. No blood thinners. Pt unable to ambulate. Left knee swollen and scraped. Complaining of left elbow pain. No head, neck, or back pain. FINDINGS: BONES AND ALIGNMENT: No acute fracture or traumatic malalignment. DEGENERATIVE CHANGES: No significant degenerative changes. SOFT TISSUES: No prevertebral soft tissue swelling. IMPRESSION: 1. No acute abnormality of the cervical spine. Electronically signed by: Pinkie Pebbles MD 04/23/2024 11:14 PM EDT RP Workstation: [id number]   CT Head Wo Contrast Result Date: 04/23/2024 EXAM: CT HEAD WITHOUT CONTRAST 04/23/2024 11:09:09 PM TECHNIQUE: CT of the head was performed without the administration of intravenous contrast. Automated exposure control, iterative reconstruction, and/or weight based adjustment of the mA/kV was utilized to reduce the radiation dose to as low as reasonably  achievable. COMPARISON: 10/04/2023 CLINICAL HISTORY: Head trauma, minor (Age >= 65y). Pt fell yesterday around 4:30 and didn't want to come to hospital until today. loss of consciousness. No blood thinners. Pt unable to ambulate. Left knee swollen and scraped. Complaining of left elbow pain. No head, neck, or back pain. FINDINGS: BRAIN AND VENTRICLES: No acute hemorrhage. No evidence of acute infarct. No hydrocephalus. Diffuse volume loss with prominence of the cisterns and sulci. Prominent extra-axial CSF vs subdural hygromas, unchanged. Empty sella. No mass effect or midline shift. Calcified atherosclerotic plaque within the cavernous/supraclinoid ICA and intradural vertebral arteries. ORBITS: Bilateral lens replacements. SINUSES: No acute abnormality. SOFT TISSUES AND SKULL: No acute soft tissue abnormality. No skull fracture. IMPRESSION: 1. No acute intracranial abnormality. Electronically signed by: Pinkie Pebbles MD 04/23/2024 11:13 PM EDT RP Workstation: [id number]     Procedures   Medications Ordered in the ED  fentaNYL (SUBLIMAZE)  injection 50 mcg (50 mcg Intravenous Given 04/23/24 2221)  sodium chloride  0.9 % bolus 500 mL (0 mLs Intravenous Stopped 04/24/24 0112)  HYDROcodone -acetaminophen  (NORCO/VICODIN) 5-325 MG per tablet 2 tablet (2 tablets Oral Given 04/24/24 0344)                                    Medical Decision Making Amount and/or Complexity of Data Reviewed Labs: ordered. Radiology: ordered.  Risk Prescription drug management.   This patient complains of fall pain unable to ambulate; this involves an extensive number of treatment Options and is a complaint that carries with it a high risk of complications and morbidity. The differential includes contusion, fracture, dislocation, dehydration, metabolic derangement, failure to thrive  I ordered, reviewed and interpreted labs, which included CBC normal chemistries with elevated creatinine total CK minimally elevated  urinalysis without signs of infection I ordered medication oral and IV pain medicine and fluids and reviewed PMP when indicated. I ordered imaging studies which included CT head and cervical spine T-spine and L-spine, x-rays of knees pelvis hip chest elbow and I independently    visualized and interpreted imaging which showed degenerative changes no acute fracture Additional history obtained from patient's daughter Previous records obtained and reviewed in epic including prior ED visits and PCP visits orthopedic visit Cardiac monitoring reviewed, sinus rhythm Social determinants considered, no significant barriers Critical Interventions: None  After the interventions stated above, I reevaluated the patient and found patient to be resting comfortably in no distress Admission and further testing considered, her care is signed out to Dr. Geroldine to follow-up on readings of imaging and labs.  If no acute findings plan would be to ambulate and see if appropriate for discharge.      Final diagnoses:  Fall, initial encounter  Acute pain of both knees  Gait disturbance    ED Discharge Orders     None          Towana Ozell BROCKS, MD 04/24/24 1029

## 2024-04-24 DIAGNOSIS — S8002XA Contusion of left knee, initial encounter: Secondary | ICD-10-CM | POA: Diagnosis not present

## 2024-04-24 LAB — URINALYSIS, ROUTINE W REFLEX MICROSCOPIC
Bacteria, UA: NONE SEEN
Bilirubin Urine: NEGATIVE
Glucose, UA: NEGATIVE mg/dL
Ketones, ur: 5 mg/dL — AB
Leukocytes,Ua: NEGATIVE
Nitrite: NEGATIVE
Protein, ur: NEGATIVE mg/dL
Specific Gravity, Urine: 1.011 (ref 1.005–1.030)
pH: 5 (ref 5.0–8.0)

## 2024-04-24 LAB — CK: Total CK: 369 U/L — ABNORMAL HIGH (ref 38–234)

## 2024-04-24 LAB — BASIC METABOLIC PANEL WITH GFR
Anion gap: 13 (ref 5–15)
BUN: 23 mg/dL (ref 8–23)
CO2: 19 mmol/L — ABNORMAL LOW (ref 22–32)
Calcium: 9 mg/dL (ref 8.9–10.3)
Chloride: 101 mmol/L (ref 98–111)
Creatinine, Ser: 1.44 mg/dL — ABNORMAL HIGH (ref 0.44–1.00)
GFR, Estimated: 36 mL/min — ABNORMAL LOW (ref >60)
Glucose, Bld: 97 mg/dL (ref 70–99)
Potassium: 4.7 mmol/L (ref 3.5–5.1)
Sodium: 132 mmol/L — ABNORMAL LOW (ref 135–145)

## 2024-04-24 MED ORDER — HYDROCODONE-ACETAMINOPHEN 5-325 MG PO TABS
2.0000 | ORAL_TABLET | Freq: Once | ORAL | Status: AC
  Administered 2024-04-24: 2 via ORAL
  Filled 2024-04-24: qty 2

## 2024-04-24 NOTE — ED Notes (Signed)
 Crackers provided to patient. Diet order placed

## 2024-04-24 NOTE — ED Provider Notes (Signed)
 Emergency Medicine Observation Re-evaluation Note  Kim Schultz is a 83 y.o. female, seen on rounds today.  Pt initially presented to the ED for complaints of Fall Currently, the patient is not having any acute complaints.  Physical Exam  BP 125/79   Pulse (!) 107   Temp 98.1 F (36.7 C) (Oral)   Resp 18   Ht 5' 7 (1.702 m)   Wt 78.5 kg   SpO2 93%   BMI 27.10 kg/m  Physical Exam General: Resting comfortably in stretcher Lungs: Normal work of breathing Psych: Calm  ED Course / MDM  EKG:EKG Interpretation Date/Time:  Thursday April 23 2024 22:22:37 EDT Ventricular Rate:  103 PR Interval:  177 QRS Duration:  101 QT Interval:  346 QTC Calculation: 453 R Axis:   -58  Text Interpretation: Sinus tachycardia Left anterior fascicular block Left ventricular hypertrophy Anterior Q waves, possibly due to LVH ST elevation suggests acute pericarditis No significant change since prior 3/25 Confirmed by Towana Sharper [phone]) on 04/23/2024 10:30:52 PM  I have reviewed the labs performed to date as well as medications administered while in observation.  Recent changes in the last 24 hours include none.  Plan  Current plan is for PT eval and placement.    Yolande Lamar BROCKS, MD 04/24/24 [phone]

## 2024-04-24 NOTE — ED Notes (Signed)
 Pt assisted to side of bed to eat her lunch tray. Daughter at bedside.

## 2024-04-24 NOTE — ED Notes (Addendum)
 Transition of Care [facility]) - Emergency Department Mini Assessment   Patient Details  Name: Kim Schultz MRN: [id number] Date of Birth: 1940-08-07  [facility] of [facility]) CM/SW Contact:    Kim Schultz Phone Number: 04/24/2024, 10:35 AM   Clinical Narrative:  CSW spoke with patient at bedside. Patient expressed being hungry and that she was waiting for daughter come up to the hospital. CSW assisted with getting patient graham crackers, peanut butter cup, and a cola drink. Patient ate the crackers and drank her cola. Afterwards, she gave CSW permission to call her daughter. CSW called daughter and shared with her about patient needing to be admitted inpt for a 3-night stay. Daughter raised concerns about patient primary insurance not covering things, her F plan and [employer] plan. CSW did share with daughter that the alternative option is HHPT/HHOT. Daughter stated that she was on her way up here. CSW will continue to follow.   Addendum 11:10 AM  CSW spoke with daughter at bedside about HH. Patient and daughter agreeable. CSW asked EDP to place orders . Kim Schultz with [facility] accepted referral for PT/OT. CSW signing off.   ED Mini Assessment: What brought you to the Emergency Department? : fall  Barriers to Discharge: Other (must enter comment) [employer] will not cover to SNF since she is not admitted inpt for a 3-night stay. Alternative option is HH.)  Barrier interventions: Insurance will not cover to SNF since she is not admitted inpt for a 3-night stay. Alternative option is HH.  Means of departure: Car  Interventions which prevented an admission or readmission: SNF Placement, Home Health Consult or Services    Patient Contact and Communications Key Contact 1: Kim Schultz   Spoke with: daughter Contact Date: 04/24/24,   Contact time: 1034 Contact Phone Number: [phone] Call outcome: Insurance will not cover to SNF since she is not admitted inpt for a 3-night stay.  Alternative option is HH.  Patient states their goals for this hospitalization and ongoing recovery are::  Being able to into a WC  CMS Medicare.gov Compare Post Acute Care list provided to:: Patient Represenative (must comment) (Daughter) Choice offered to / list presented to : Adult Children  Admission diagnosis:  Fall Patient Active Problem List   Diagnosis Date Noted   Colitis 11/17/2021   Acute kidney injury superimposed on chronic kidney disease 11/17/2021   Thyroid disease    Irritable bowel syndrome with diarrhea    Acute renal failure superimposed on stage 3 chronic kidney disease (HCC)    Left elbow pain    Fall 03/26/2019   Hypertension 01/29/2018   Hypothyroidism 01/29/2018   GERD (gastroesophageal reflux disease) 01/29/2018   AKI (acute kidney injury) 01/29/2018   CKD (chronic kidney disease) stage 3, GFR 30-59 ml/min (HCC) 01/29/2018   Intractable nausea and vomiting 01/29/2018   Left knee pain 09/13/2015   PCP:  Kim Raina Elizabeth, NP Pharmacy:   [facility] - [place], [place] - [address] [place] [address] Phone: [phone] Fax: [phone]

## 2024-04-24 NOTE — ED Provider Notes (Signed)
  Physical Exam  BP 126/82   Pulse (!) 107   Temp 98.1 F (36.7 C) (Oral)   Resp 20   Ht 5' 7 (1.702 m)   Wt 78.5 kg   SpO2 92%   BMI 27.10 kg/m   Physical Exam Vitals and nursing note reviewed.  Constitutional:      General: She is not in acute distress.    Appearance: She is well-developed. She is not diaphoretic.  HENT:     Head: Normocephalic and atraumatic.  Cardiovascular:     Rate and Rhythm: Normal rate and regular rhythm.     Heart sounds: No murmur heard.    No friction rub. No gallop.  Pulmonary:     Effort: Pulmonary effort is normal. No respiratory distress.     Breath sounds: Normal breath sounds. No wheezing.  Abdominal:     General: Bowel sounds are normal. There is no distension.     Palpations: Abdomen is soft.     Tenderness: There is no abdominal tenderness.  Musculoskeletal:        General: Normal range of motion.     Cervical back: Normal range of motion and neck supple.  Skin:    General: Skin is warm and dry.  Neurological:     General: No focal deficit present.     Mental Status: She is alert and oriented to person, place, and time.     Procedures  Procedures  ED Course / MDM    Medical Decision Making Amount and/or Complexity of Data Reviewed Labs: ordered. Radiology: ordered.  Risk Prescription drug management.   Care assumed from Dr. Towana at shift change.  Patient awaiting multiple imaging studies and laboratory results.  Patient brought here by daughter stating that she is weak and unsteady.  She apparently fell yesterday and has abrasions to her knees and discomfort in various areas.  CT and x-ray imaging has returned and all are unremarkable.  Laboratory studies showed no acute abnormality.  Urinalysis inconsistent with infection.  Results of the test discussed with patient at and daughter at bedside.  I see no obvious cause of her weakness and workup is basically unremarkable.  Situation discussed with Dr. Adefeso from the  hospitalist service who does not feel as though the patient meets inpatient criteria.  The daughter is very concerned about her going home and does not feel safe taking her home.  Patient will remain in the emergency department overnight to undergo PT consultation and TOC consultation.       Geroldine Berg, MD 04/24/24 [phone]

## 2024-04-24 NOTE — Discharge Instructions (Signed)
Follow-up with your primary doctor in 2 to 3 days. 

## 2024-04-24 NOTE — ED Provider Notes (Signed)
 Patient evaluated by physical therapy.  Recommends PT and OT.  Unfortunately patient does not meet admission criteria at this point in time and cannot be placed at SNF from the emergency department.  Patient's daughter is coming to pick her up.  Home health PT and OT ordered.  Patient reevaluated.  She is overall well-appearing with no new complaints.   Kim Lamar BROCKS, MD 04/24/24 2005

## 2024-04-24 NOTE — Evaluation (Signed)
 Physical Therapy Evaluation Patient Details Name: Kim Schultz MRN: [id number] DOB: 1940/07/19 Today's Date: 04/24/2024  History of Present Illness  Patient is an 83 year old-year-old female who sustained a fall at home on Tuesday night and was unable to get up from the floor, family saw her on the following day (Wednesday), helped her to get up, patient had difficulty in ambulation after this and she was brought to the ED yesterday.  Several imaging studies  done showed no acute osseous abnormality.  Workup done in the ED was unremarkable.  [facility] was asked to admit patient since the family does not feel comfortable in taking her home.  Since patient does not have any acute pathology to meet inpatient criteria.  It was agreed with the ED physician Dr. Geroldine that PT and [facility] can be consulted and the patient will be seen in the ED in the morning for evaluation and recommendation regarding placement for patient.   Clinical Impression  Patient demonstrates slow labored movement for sitting up at bedside with c/o left sided shoulder and LLE pain, very unsteady on feet and required use of RW for transferring to chair. Patient tolerated ambulating into hallway with slow labored movement and limited mostly due to c/o fatigue. Patient tolerated sitting up in chair after therapy - RN notified. Patient declined going to SNF and taken home by family.          If plan is discharge home, recommend the following: A lot of help with walking and/or transfers;A lot of help with bathing/dressing/bathroom;Assistance with cooking/housework;Help with stairs or ramp for entrance;Direct supervision/assist for medications management   Can travel by private vehicle   Yes    Equipment Recommendations None recommended by PT  Recommendations for Other Services       Functional Status Assessment Patient has had a recent decline in their functional status and demonstrates the ability to make significant improvements in  function in a reasonable and predictable amount of time.     Precautions / Restrictions Precautions Precautions: Fall Recall of Precautions/Restrictions: Impaired Restrictions Weight Bearing Restrictions Per Provider Order: No      Mobility  Bed Mobility Overal bed mobility: Needs Assistance Bed Mobility: Supine to Sit     Supine to sit: Min assist, Mod assist     General bed mobility comments: slow labored movement    Transfers Overall transfer level: Needs assistance Equipment used: Rolling walker (2 wheels) Transfers: Sit to/from Stand, Bed to chair/wheelchair/BSC Sit to Stand: Min assist   Step pivot transfers: Min assist       General transfer comment: slow labored unsteady movement    Ambulation/Gait Ambulation/Gait assistance: Min assist Gait Distance (Feet): 35 Feet Assistive device: Rolling walker (2 wheels) Gait Pattern/deviations: Decreased step length - right, Decreased step length - left, Decreased stride length Gait velocity: decreased     General Gait Details: slow labored movement requiring increased time for making turns and limited mostly due to fatigue  Stairs            Wheelchair Mobility     Tilt Bed    Modified Rankin (Stroke Patients Only)       Balance Overall balance assessment: Needs assistance Sitting-balance support: Feet supported, No upper extremity supported Sitting balance-Leahy Scale: Fair Sitting balance - Comments: seated at EOB   Standing balance support: Reliant on assistive device for balance, During functional activity, Bilateral upper extremity supported Standing balance-Leahy Scale: Poor Standing balance comment: fair/poor using RW  Pertinent Vitals/Pain Pain Assessment Pain Assessment: Faces Faces Pain Scale: Hurts little more Pain Location: left shoulder, LLE Pain Descriptors / Indicators: Sore, Discomfort Pain Intervention(s): Limited activity within  patient's tolerance, Monitored during session, Repositioned    Home Living Family/patient expects to be discharged to:: Private residence Living Arrangements: Alone Available Help at Discharge: Family;Available PRN/intermittently Type of Home: House         Home Layout: One level Home Equipment: Agricultural consultant (2 wheels) Additional Comments: Patient is poor historian and unable to answer most questions    Prior Function Prior Level of Function : Needs assist       Physical Assist : Mobility (physical);ADLs (physical) Mobility (physical): Bed mobility;Transfers;Gait;Stairs   Mobility Comments: household ambulation without AD, per patient ADLs Comments: Assisted by family     Extremity/Trunk Assessment   Upper Extremity Assessment Upper Extremity Assessment: Generalized weakness    Lower Extremity Assessment Lower Extremity Assessment: Generalized weakness    Cervical / Trunk Assessment Cervical / Trunk Assessment: Normal  Communication   Communication Communication: No apparent difficulties    Cognition Arousal: Alert Behavior During Therapy: WFL for tasks assessed/performed, Anxious   PT - Cognitive impairments: No family/caregiver present to determine baseline                       PT - Cognition Comments: Poor history for PLOF and home set up Following commands: Intact       Cueing Cueing Techniques: Verbal cues, Tactile cues     General Comments      Exercises     Assessment/Plan    PT Assessment All further PT needs can be met in the next venue of care  PT Problem List         PT Treatment Interventions      PT Goals (Current goals can be found in the Care Plan section)  Acute Rehab PT Goals Patient Stated Goal: return home with family to assist PT Goal Formulation: With patient Time For Goal Achievement: 04/24/24 Potential to Achieve Goals: Good    Frequency       Co-evaluation               AM-PAC PT 6  Clicks Mobility  Outcome Measure Help needed turning from your back to your side while in a flat bed without using bedrails?: A Lot Help needed moving from lying on your back to sitting on the side of a flat bed without using bedrails?: A Little Help needed moving to and from a bed to a chair (including a wheelchair)?: A Little Help needed standing up from a chair using your arms (e.g., wheelchair or bedside chair)?: A Little Help needed to walk in hospital room?: A Lot Help needed climbing 3-5 steps with a railing? : A Lot 6 Click Score: 15    End of Session   Activity Tolerance: Patient tolerated treatment well;Patient limited by fatigue;Patient limited by pain Patient left: in chair;with call bell/phone within reach Nurse Communication: Mobility status PT Visit Diagnosis: Unsteadiness on feet (R26.81);Other abnormalities of gait and mobility (R26.89);History of falling (Z91.81);Muscle weakness (generalized) (M62.81)    Time: 9183-9161 PT Time Calculation (min) (ACUTE ONLY): 22 min   Charges:   PT Evaluation $PT Eval Moderate Complexity: 1 Mod PT Treatments $Therapeutic Activity: 8-22 mins PT General Charges $$ ACUTE PT VISIT: 1 Visit         2:10 PM, 04/24/24 Lynwood Music, MPT Physical Therapist with [facility]  [phone] office [phone] mobile phone

## 2024-04-24 NOTE — ED Notes (Signed)
 Pt ambulated in hall with walker and PT beside her. Pt placed back in bedside recliner.

## 2024-04-24 NOTE — Progress Notes (Signed)
 Patient is an 83 year old-year-old female who sustained a fall at home on Tuesday night and was unable to get up from the floor, family saw her on the following day (Wednesday), helped her to get up, patient had difficulty in ambulation after this and she was brought to the ED yesterday.  Several imaging studies  done showed no acute osseous abnormality.  Workup done in the ED was unremarkable.  [facility] was asked to admit patient since the family does not feel comfortable in taking her home. Since patient does not have any acute pathology to meet inpatient criteria.  It was agreed with the ED physician Dr. Geroldine that PT and [facility] can be consulted and the patient will be seen in the ED in the morning for evaluation and recommendation regarding placement for patient.
